# Patient Record
Sex: Female | Born: 1940 | Race: Black or African American | Hispanic: No | Marital: Married
Health system: Southern US, Community
[De-identification: ages and names within clinical notes are randomized; demographics above are authoritative.]

---

## 1997-08-03 ENCOUNTER — Ambulatory Visit (HOSPITAL_COMMUNITY): Admission: RE | Admit: 1997-08-03 | Discharge: 1997-08-03 | Payer: Self-pay | Admitting: Obstetrics

## 1997-09-06 ENCOUNTER — Other Ambulatory Visit: Admission: RE | Admit: 1997-09-06 | Discharge: 1997-09-06 | Payer: Self-pay | Admitting: Obstetrics

## 1998-09-13 ENCOUNTER — Ambulatory Visit (HOSPITAL_COMMUNITY): Admission: RE | Admit: 1998-09-13 | Discharge: 1998-09-13 | Payer: Self-pay | Admitting: Obstetrics

## 1998-09-18 ENCOUNTER — Other Ambulatory Visit: Admission: RE | Admit: 1998-09-18 | Discharge: 1998-09-18 | Payer: Self-pay | Admitting: Obstetrics

## 1998-09-21 ENCOUNTER — Ambulatory Visit (HOSPITAL_COMMUNITY): Admission: RE | Admit: 1998-09-21 | Discharge: 1998-09-21 | Payer: Self-pay | Admitting: Obstetrics

## 1998-09-21 ENCOUNTER — Encounter: Payer: Self-pay | Admitting: Obstetrics

## 1999-03-25 ENCOUNTER — Encounter: Payer: Self-pay | Admitting: Obstetrics

## 1999-03-25 ENCOUNTER — Ambulatory Visit (HOSPITAL_COMMUNITY): Admission: RE | Admit: 1999-03-25 | Discharge: 1999-03-25 | Payer: Self-pay | Admitting: Obstetrics

## 1999-04-02 ENCOUNTER — Ambulatory Visit (HOSPITAL_COMMUNITY): Admission: RE | Admit: 1999-04-02 | Discharge: 1999-04-02 | Payer: Self-pay | Admitting: Gastroenterology

## 1999-11-05 ENCOUNTER — Encounter: Payer: Self-pay | Admitting: Obstetrics

## 1999-11-05 ENCOUNTER — Encounter: Admission: RE | Admit: 1999-11-05 | Discharge: 1999-11-05 | Payer: Self-pay | Admitting: Obstetrics

## 1999-12-23 ENCOUNTER — Other Ambulatory Visit: Admission: RE | Admit: 1999-12-23 | Discharge: 1999-12-23 | Payer: Self-pay | Admitting: Obstetrics

## 2000-11-05 ENCOUNTER — Encounter: Payer: Self-pay | Admitting: Obstetrics

## 2000-11-05 ENCOUNTER — Ambulatory Visit (HOSPITAL_COMMUNITY): Admission: RE | Admit: 2000-11-05 | Discharge: 2000-11-05 | Payer: Self-pay | Admitting: *Deleted

## 2001-11-24 ENCOUNTER — Encounter: Payer: Self-pay | Admitting: Obstetrics

## 2001-11-24 ENCOUNTER — Ambulatory Visit (HOSPITAL_COMMUNITY): Admission: RE | Admit: 2001-11-24 | Discharge: 2001-11-24 | Payer: Self-pay | Admitting: Obstetrics

## 2002-12-01 ENCOUNTER — Encounter: Payer: Self-pay | Admitting: Obstetrics

## 2002-12-01 ENCOUNTER — Ambulatory Visit (HOSPITAL_COMMUNITY): Admission: RE | Admit: 2002-12-01 | Discharge: 2002-12-01 | Payer: Self-pay | Admitting: Obstetrics

## 2004-01-01 ENCOUNTER — Ambulatory Visit (HOSPITAL_COMMUNITY): Admission: RE | Admit: 2004-01-01 | Discharge: 2004-01-01 | Payer: Self-pay | Admitting: Obstetrics

## 2004-01-04 ENCOUNTER — Encounter: Admission: RE | Admit: 2004-01-04 | Discharge: 2004-01-04 | Payer: Self-pay | Admitting: Obstetrics

## 2012-12-16 DIAGNOSIS — R609 Edema, unspecified: Secondary | ICD-10-CM

## 2015-12-27 ENCOUNTER — Inpatient Hospital Stay
Admission: AD | Admit: 2015-12-27 | Discharge: 2016-01-16 | Disposition: A | Discharge status: Skilled Nursing Facility | Payer: Self-pay | Source: Other Acute Inpatient Hospital | Attending: Internal Medicine | Admitting: Internal Medicine

## 2015-12-27 DIAGNOSIS — R0602 Shortness of breath: Secondary | ICD-10-CM

## 2015-12-27 DIAGNOSIS — I509 Heart failure, unspecified: Secondary | ICD-10-CM

## 2015-12-27 DIAGNOSIS — E8770 Fluid overload, unspecified: Secondary | ICD-10-CM

## 2015-12-27 DIAGNOSIS — R140 Abdominal distension (gaseous): Secondary | ICD-10-CM

## 2015-12-28 ENCOUNTER — Other Ambulatory Visit (HOSPITAL_COMMUNITY): Payer: Self-pay

## 2015-12-28 LAB — COMPREHENSIVE METABOLIC PANEL
ALT: 9 U/L — ABNORMAL LOW (ref 14–54)
AST: 37 U/L (ref 15–41)
Albumin: 2.4 g/dL — ABNORMAL LOW (ref 3.5–5.0)
Alkaline Phosphatase: 69 U/L (ref 38–126)
Anion gap: 11 (ref 5–15)
BILIRUBIN TOTAL: 0.8 mg/dL (ref 0.3–1.2)
BUN: 21 mg/dL — AB (ref 6–20)
CO2: 22 mmol/L (ref 22–32)
CREATININE: 2.53 mg/dL — AB (ref 0.44–1.00)
Calcium: 8.5 mg/dL — ABNORMAL LOW (ref 8.9–10.3)
Chloride: 100 mmol/L — ABNORMAL LOW (ref 101–111)
GFR, EST AFRICAN AMERICAN: 20 mL/min — AB (ref >60)
GFR, EST NON AFRICAN AMERICAN: 17 mL/min — AB (ref >60)
Glucose, Bld: 141 mg/dL — ABNORMAL HIGH (ref 65–99)
POTASSIUM: 3.6 mmol/L (ref 3.5–5.1)
Sodium: 133 mmol/L — ABNORMAL LOW (ref 135–145)
TOTAL PROTEIN: 5.6 g/dL — AB (ref 6.5–8.1)

## 2015-12-28 LAB — CBC WITH DIFFERENTIAL/PLATELET
BASOS PCT: 1 %
Basophils Absolute: 0 10*3/uL (ref 0.0–0.1)
EOS ABS: 0.5 10*3/uL (ref 0.0–0.7)
EOS PCT: 8 %
HCT: 30.4 % — ABNORMAL LOW (ref 36.0–46.0)
HEMOGLOBIN: 9.8 g/dL — AB (ref 12.0–15.0)
LYMPHS ABS: 1.2 10*3/uL (ref 0.7–4.0)
Lymphocytes Relative: 21 %
MCH: 30.8 pg (ref 26.0–34.0)
MCHC: 32.2 g/dL (ref 30.0–36.0)
MCV: 95.6 fL (ref 78.0–100.0)
MONO ABS: 0.7 10*3/uL (ref 0.1–1.0)
MONOS PCT: 12 %
Neutro Abs: 3.5 10*3/uL (ref 1.7–7.7)
Neutrophils Relative %: 59 %
PLATELETS: 67 10*3/uL — AB (ref 150–400)
RBC: 3.18 MIL/uL — ABNORMAL LOW (ref 3.87–5.11)
RDW: 18.5 % — AB (ref 11.5–15.5)
WBC: 6 10*3/uL (ref 4.0–10.5)

## 2015-12-28 LAB — TSH: TSH: 6.989 u[IU]/mL — ABNORMAL HIGH (ref 0.350–4.500)

## 2015-12-28 LAB — PROTIME-INR
INR: 1.9 — ABNORMAL HIGH (ref 0.00–1.49)
PROTHROMBIN TIME: 21.7 s — AB (ref 11.6–15.2)

## 2015-12-28 LAB — BRAIN NATRIURETIC PEPTIDE: B NATRIURETIC PEPTIDE 5: 146.3 pg/mL — AB (ref 0.0–100.0)

## 2015-12-28 LAB — MAGNESIUM: MAGNESIUM: 1.7 mg/dL (ref 1.7–2.4)

## 2015-12-28 LAB — PHOSPHORUS: PHOSPHORUS: 2.6 mg/dL (ref 2.5–4.6)

## 2015-12-28 NOTE — Consult Note (Signed)
Date: 12/28/2015                  Patient Name:  Christina Duke  MRN: [id number]  DOB: 1940-07-13  Age / Sex: 75 y.o., female         PCP: No primary care provider on file.                 Service Requesting Consult: Internal medicine                 Reason for Consult: Acute renal failure, dialysis            History of Present Illness: Patient is a 75 y.o. female with medical problems of chronic kidney disease stage IV, diabetic nephropathy, plus history of DVT, gout, chronic thrombocytopenia, liver cirrhosis, deconditioning, morbid obesity, who was admitted on 12/27/2015   Patient was hospitalized initially for generalized swelling, anasarca. Hospital course complicated by GI bleed and DVT. She was also treated for UTI with broad-spectrum antibiotics. Patient underwent ultrasound-guided paracentesis on July 11.  8700 cc of clear fluid was removed.  According to transfer notes, dialysis catheter was placed on July 5  And patient was initiated on hemodialysis. Per patient, she has had 5 treatments until now.  Medications: Outpatient medications: No prescriptions prior to admission    Current medications: No current facility-administered medications for this encounter.   famotidine Metoprolol 12.5 mg twice a day Prostat 30 mL twice a day Heparin 5000 units subcutaneous every 8 hours   Allergies: Allergies not on file  Cefazolin, doxycycline  Past Medical History: No past medical history on file. As noted above  Past Surgical History: No past surgical history on file.   Family History: No family history on file.   Social History: Social History   Social History  . Marital Status: Married    Spouse Name: N/A  . Number of Children: N/A  . Years of Education: N/A   Occupational History  . Not on file.   Social History Main Topics  . Smoking status: Not on file  . Smokeless tobacco: Not on file  . Alcohol Use: Not on file  . Drug Use: Not on file  .  Sexual Activity: Not on file   Other Topics Concern  . Not on file   Social History Narrative  . No narrative on file     Review of Systems: Gen: no fevers, chills HEENT: no blurry vision, no hearing issues CV: no chest pain or shortness of breath Resp: no cough, sputum XB:JYNWGNFA is okay, no blood in stool GU : decreased urine output MS: difficulty walking Derm:  no acute complaints Psych:no complaints Heme: no complaints Neuro: weakness in legs Endocrine. Diabetic  Vital Signs: There were no vitals taken for this visit.  No intake or output data in the 24 hours ending 12/28/15 1541  Weight trends: There were no vitals filed for this visit.  Physical Exam: General:  elderly woman, laying in the bed  HEENT  anicteric, moist mucous membranes  Neck:  supple  Lungs: Normal breathing effort, clear to auscultation  Heart::  regular rhythm, no rub or gallop  Abdomen: Soft, distended, nontender  Extremities: left leg edema  Neurologic: Alert, oriented  Skin: No acute rashes  Access: Right IJ PermCath          Lab results: Basic Metabolic Panel:  Recent Labs Lab 12/28/15 0534  NA 133*  K 3.6  CL 100*  CO2 22  GLUCOSE 141*  BUN 21*  CREATININE 2.53*  CALCIUM 8.5*  MG 1.7  PHOS 2.6    Liver Function Tests:  Recent Labs Lab 12/28/15 0534  AST 37  ALT 9*  ALKPHOS 69  BILITOT 0.8  PROT 5.6*  ALBUMIN 2.4*   No results for input(s): LIPASE, AMYLASE in the last 168 hours. No results for input(s): AMMONIA in the last 168 hours.  CBC:  Recent Labs Lab 12/28/15 0534  WBC 6.0  NEUTROABS 3.5  HGB 9.8*  HCT 30.4*  MCV 95.6  PLT 67*    Cardiac Enzymes: No results for input(s): CKTOTAL, TROPONINI in the last 168 hours.  BNP: Invalid input(s): POCBNP  CBG: No results for input(s): GLUCAP in the last 168 hours.  Microbiology: No results found for this or any previous visit (from the past 720 hour(s)).   Coagulation Studies:  Recent  Labs  12/28/15 0534  LABPROT 21.7*  INR 1.90*    Urinalysis: No results for input(s): COLORURINE, LABSPEC, PHURINE, GLUCOSEU, HGBUR, BILIRUBINUR, KETONESUR, PROTEINUR, UROBILINOGEN, NITRITE, LEUKOCYTESUR in the last 72 hours.  Invalid input(s): APPERANCEUR      Imaging: Dg Chest Port 1 View  12/28/2015  CLINICAL DATA:  Congestive heart failure EXAM: PORTABLE CHEST 1 VIEW COMPARISON:  08/13/2015; chest CT - 08/13/2015 FINDINGS: Grossly unchanged enlarged cardiac silhouette and mediastinal contours with atherosclerotic plaque within the thoracic aorta. Interval placement of right internal jugular approach dialysis catheter with tips terminating over the superior aspect of the right atrium. Decreased lung volumes with minimal bibasilar opacities, likely atelectasis. Minimal perihilar opacities also favored to represent atelectasis. No discrete focal airspace opacities. No pleural effusion or pneumothorax. No evidence of edema. Unchanged bones including post left-sided rotator cuff repair advanced degenerative change of the left glenohumeral joint, incompletely evaluated. IMPRESSION: 1. Cardiomegaly, hypoventilation bibasilar atelectasis without definitive superimposed acute cardiopulmonary disease. Specifically, no evidence of edema. 2.  Aortic Atherosclerosis (ICD10-170.0) Electronically Signed   By: Simonne ComeJohn  Watts M.D.   On: 12/28/2015 07:42      Assessment & Plan: Pt is a 75 y.o. yo female with  medical problems of chronic kidney disease stage IV, diabetic nephropathy, plus history of DVT, gout, chronic thrombocytopenia, liver cirrhosis, deconditioning, morbid obesity, who was admitted on 12/27/2015   1. Acute on chronic kidney disease stage IV - Failed Lasix drip at outside hospital and initiated on hemodialysis - We will continue hemodialysis on a regular schedule  2. Cirrhosis with sequlae - thrombocytopenia  3. Anemia - check iron studies

## 2015-12-29 LAB — CBC WITH DIFFERENTIAL/PLATELET
BASOS ABS: 0 10*3/uL (ref 0.0–0.1)
BASOS PCT: 1 %
Eosinophils Absolute: 0.5 10*3/uL (ref 0.0–0.7)
Eosinophils Relative: 8 %
HEMATOCRIT: 30.6 % — AB (ref 36.0–46.0)
HEMOGLOBIN: 9.9 g/dL — AB (ref 12.0–15.0)
Lymphocytes Relative: 20 %
Lymphs Abs: 1.1 10*3/uL (ref 0.7–4.0)
MCH: 30.5 pg (ref 26.0–34.0)
MCHC: 32.4 g/dL (ref 30.0–36.0)
MCV: 94.2 fL (ref 78.0–100.0)
Monocytes Absolute: 0.6 10*3/uL (ref 0.1–1.0)
Monocytes Relative: 11 %
NEUTROS ABS: 3.4 10*3/uL (ref 1.7–7.7)
NEUTROS PCT: 60 %
Platelets: 70 10*3/uL — ABNORMAL LOW (ref 150–400)
RBC: 3.25 MIL/uL — AB (ref 3.87–5.11)
RDW: 18.4 % — ABNORMAL HIGH (ref 11.5–15.5)
WBC: 5.6 10*3/uL (ref 4.0–10.5)

## 2015-12-29 LAB — MAGNESIUM: MAGNESIUM: 1.9 mg/dL (ref 1.7–2.4)

## 2015-12-29 LAB — FERRITIN: FERRITIN: 124 ng/mL (ref 11–307)

## 2015-12-29 LAB — RENAL FUNCTION PANEL
ALBUMIN: 2.6 g/dL — AB (ref 3.5–5.0)
ANION GAP: 7 (ref 5–15)
BUN: 28 mg/dL — AB (ref 6–20)
CO2: 23 mmol/L (ref 22–32)
Calcium: 8.5 mg/dL — ABNORMAL LOW (ref 8.9–10.3)
Chloride: 100 mmol/L — ABNORMAL LOW (ref 101–111)
Creatinine, Ser: 2.71 mg/dL — ABNORMAL HIGH (ref 0.44–1.00)
GFR, EST AFRICAN AMERICAN: 19 mL/min — AB (ref >60)
GFR, EST NON AFRICAN AMERICAN: 16 mL/min — AB (ref >60)
Glucose, Bld: 126 mg/dL — ABNORMAL HIGH (ref 65–99)
PHOSPHORUS: 3.2 mg/dL (ref 2.5–4.6)
POTASSIUM: 3.6 mmol/L (ref 3.5–5.1)
Sodium: 130 mmol/L — ABNORMAL LOW (ref 135–145)

## 2015-12-29 LAB — IRON AND TIBC: Iron: 31 ug/dL (ref 28–170)

## 2015-12-29 LAB — TRANSFERRIN

## 2015-12-29 LAB — HEMOGLOBIN A1C
HEMOGLOBIN A1C: 5.7 % — AB (ref 4.8–5.6)
Mean Plasma Glucose: 117 mg/dL

## 2015-12-29 LAB — T4, FREE: Free T4: 1.34 ng/dL — ABNORMAL HIGH (ref 0.61–1.12)

## 2015-12-30 LAB — HEPATITIS B SURFACE ANTIBODY,QUALITATIVE: Hep B S Ab: NONREACTIVE

## 2015-12-30 LAB — HEPATITIS B SURFACE ANTIGEN: Hepatitis B Surface Ag: NEGATIVE

## 2015-12-30 LAB — HEPATITIS B CORE ANTIBODY, TOTAL: HEP B C TOTAL AB: NEGATIVE

## 2015-12-31 LAB — RENAL FUNCTION PANEL
Albumin: 2.5 g/dL — ABNORMAL LOW (ref 3.5–5.0)
Anion gap: 8 (ref 5–15)
BUN: 30 mg/dL — AB (ref 6–20)
CHLORIDE: 103 mmol/L (ref 101–111)
CO2: 21 mmol/L — ABNORMAL LOW (ref 22–32)
Calcium: 8.2 mg/dL — ABNORMAL LOW (ref 8.9–10.3)
Creatinine, Ser: 2.93 mg/dL — ABNORMAL HIGH (ref 0.44–1.00)
GFR calc Af Amer: 17 mL/min — ABNORMAL LOW (ref >60)
GFR, EST NON AFRICAN AMERICAN: 15 mL/min — AB (ref >60)
Glucose, Bld: 100 mg/dL — ABNORMAL HIGH (ref 65–99)
POTASSIUM: 4.2 mmol/L (ref 3.5–5.1)
Phosphorus: 3.8 mg/dL (ref 2.5–4.6)
Sodium: 132 mmol/L — ABNORMAL LOW (ref 135–145)

## 2015-12-31 LAB — CBC
HEMATOCRIT: 30.2 % — AB (ref 36.0–46.0)
Hemoglobin: 10 g/dL — ABNORMAL LOW (ref 12.0–15.0)
MCH: 31.3 pg (ref 26.0–34.0)
MCHC: 33.1 g/dL (ref 30.0–36.0)
MCV: 94.4 fL (ref 78.0–100.0)
Platelets: 60 10*3/uL — ABNORMAL LOW (ref 150–400)
RBC: 3.2 MIL/uL — ABNORMAL LOW (ref 3.87–5.11)
RDW: 18.7 % — AB (ref 11.5–15.5)
WBC: 6.5 10*3/uL (ref 4.0–10.5)

## 2015-12-31 NOTE — Progress Notes (Signed)
  [facility]  ROUNDING NOTE   Subjective:  Patient completed dialysis today.  Net fluid removal was 200cc. Sitting up in chair at the moment.    Objective:  Vital signs in last 24 hours:   T 97.3 P: 87 R: 18 BP: 113/59     Physical Exam: General: NAD, sitting up  Head: Normocephalic, atraumatic. Moist oral mucosal membranes  Eyes: Anicteric  Neck: Supple, trachea midline  Lungs:  Clear to auscultation normal effort  Heart: S1S2 no rubs  Abdomen:  Soft, nontender, distended, BS present   Extremities: trace peripheral edema.  Neurologic: Nonfocal, moving all four extremities  Skin: No lesions  Access: R IJ PC    Basic Metabolic Panel:  Recent Labs Lab 12/28/15 0534 12/29/15 0755 12/29/15 0757 12/31/15 0520  NA 133*  --  130* 132*  K 3.6  --  3.6 4.2  CL 100*  --  100* 103  CO2 22  --  23 21*  GLUCOSE 141*  --  126* 100*  BUN 21*  --  28* 30*  CREATININE 2.53*  --  2.71* 2.93*  CALCIUM 8.5*  --  8.5* 8.2*  MG 1.7 1.9  --   --   PHOS 2.6  --  3.2 3.8    Liver Function Tests:  Recent Labs Lab 12/28/15 0534 12/29/15 0757 12/31/15 0520  AST 37  --   --   ALT 9*  --   --   ALKPHOS 69  --   --   BILITOT 0.8  --   --   PROT 5.6*  --   --   ALBUMIN 2.4* 2.6* 2.5*   No results for input(s): LIPASE, AMYLASE in the last 168 hours. No results for input(s): AMMONIA in the last 168 hours.  CBC:  Recent Labs Lab 12/28/15 0534 12/29/15 0755  WBC 6.0 5.6  NEUTROABS 3.5 3.4  HGB 9.8* 9.9*  HCT 30.4* 30.6*  MCV 95.6 94.2  PLT 67* 70*    Cardiac Enzymes: No results for input(s): CKTOTAL, CKMB, CKMBINDEX, TROPONINI in the last 168 hours.  BNP: Invalid input(s): POCBNP  CBG: No results for input(s): GLUCAP in the last 168 hours.  Microbiology: No results found for this or any previous visit.  Coagulation Studies: No results for input(s): LABPROT, INR in the last 72 hours.  Urinalysis: No results for input(s): COLORURINE, LABSPEC,  PHURINE, GLUCOSEU, HGBUR, BILIRUBINUR, KETONESUR, PROTEINUR, UROBILINOGEN, NITRITE, LEUKOCYTESUR in the last 72 hours.  Invalid input(s): APPERANCEUR    Imaging: No results found.   Medications:       Assessment/ Plan:  75 y.o. female with medical problems of chronic kidney disease stage IV, diabetic nephropathy, plus history of DVT, gout, chronic thrombocytopenia, liver cirrhosis, deconditioning, morbid obesity, who was admitted on 12/27/2015   1.  Acute renal failure/CKD stage IV:  Pt completed HD today, UF achieved was 200cc, we will plan for HD again on Wednesday for now, unclear if she'll regain enough renal function to come off of dialysis.   2.  Anemia of CKD:  Hgb currently 9.9, consider adding aranesp to her medication regimen if hgb drops further.   3.  Hyponatremia: likely due to liver disease, continue to monitor serum Na.    LOS:  Christina Duke 7/17/20173:09 PM

## 2016-01-01 ENCOUNTER — Other Ambulatory Visit (HOSPITAL_COMMUNITY): Payer: Self-pay

## 2016-01-01 LAB — COMPREHENSIVE METABOLIC PANEL
ALT: 11 U/L — ABNORMAL LOW (ref 14–54)
AST: 45 U/L — AB (ref 15–41)
Albumin: 2.5 g/dL — ABNORMAL LOW (ref 3.5–5.0)
Alkaline Phosphatase: 87 U/L (ref 38–126)
Anion gap: 9 (ref 5–15)
BILIRUBIN TOTAL: 1.7 mg/dL — AB (ref 0.3–1.2)
BUN: 18 mg/dL (ref 6–20)
CALCIUM: 8 mg/dL — AB (ref 8.9–10.3)
CO2: 24 mmol/L (ref 22–32)
Chloride: 100 mmol/L — ABNORMAL LOW (ref 101–111)
Creatinine, Ser: 2.49 mg/dL — ABNORMAL HIGH (ref 0.44–1.00)
GFR, EST AFRICAN AMERICAN: 21 mL/min — AB (ref >60)
GFR, EST NON AFRICAN AMERICAN: 18 mL/min — AB (ref >60)
GLUCOSE: 90 mg/dL (ref 65–99)
Potassium: 3.6 mmol/L (ref 3.5–5.1)
Sodium: 133 mmol/L — ABNORMAL LOW (ref 135–145)
Total Protein: 5.9 g/dL — ABNORMAL LOW (ref 6.5–8.1)

## 2016-01-01 LAB — CBC WITH DIFFERENTIAL/PLATELET
BASOS PCT: 0 %
Basophils Absolute: 0 10*3/uL (ref 0.0–0.1)
EOS ABS: 0.5 10*3/uL (ref 0.0–0.7)
EOS PCT: 10 %
HEMATOCRIT: 29.4 % — AB (ref 36.0–46.0)
Hemoglobin: 9.5 g/dL — ABNORMAL LOW (ref 12.0–15.0)
Lymphocytes Relative: 24 %
Lymphs Abs: 1.2 10*3/uL (ref 0.7–4.0)
MCH: 30.7 pg (ref 26.0–34.0)
MCHC: 32.3 g/dL (ref 30.0–36.0)
MCV: 95.1 fL (ref 78.0–100.0)
MONO ABS: 0.7 10*3/uL (ref 0.1–1.0)
MONOS PCT: 14 %
NEUTROS ABS: 2.6 10*3/uL (ref 1.7–7.7)
Neutrophils Relative %: 52 %
PLATELETS: 66 10*3/uL — AB (ref 150–400)
RBC: 3.09 MIL/uL — ABNORMAL LOW (ref 3.87–5.11)
RDW: 18.9 % — AB (ref 11.5–15.5)
WBC: 5 10*3/uL (ref 4.0–10.5)

## 2016-01-02 ENCOUNTER — Other Ambulatory Visit (HOSPITAL_COMMUNITY): Payer: Self-pay

## 2016-01-02 LAB — RENAL FUNCTION PANEL
ANION GAP: 11 (ref 5–15)
Albumin: 2.4 g/dL — ABNORMAL LOW (ref 3.5–5.0)
BUN: 21 mg/dL — ABNORMAL HIGH (ref 6–20)
CHLORIDE: 99 mmol/L — AB (ref 101–111)
CO2: 22 mmol/L (ref 22–32)
Calcium: 8.3 mg/dL — ABNORMAL LOW (ref 8.9–10.3)
Creatinine, Ser: 2.62 mg/dL — ABNORMAL HIGH (ref 0.44–1.00)
GFR calc non Af Amer: 17 mL/min — ABNORMAL LOW (ref >60)
GFR, EST AFRICAN AMERICAN: 19 mL/min — AB (ref >60)
GLUCOSE: 132 mg/dL — AB (ref 65–99)
Phosphorus: 4 mg/dL (ref 2.5–4.6)
Potassium: 3.9 mmol/L (ref 3.5–5.1)
Sodium: 132 mmol/L — ABNORMAL LOW (ref 135–145)

## 2016-01-02 LAB — CBC
HCT: 28.9 % — ABNORMAL LOW (ref 36.0–46.0)
HEMOGLOBIN: 9.5 g/dL — AB (ref 12.0–15.0)
MCH: 31.1 pg (ref 26.0–34.0)
MCHC: 32.9 g/dL (ref 30.0–36.0)
MCV: 94.8 fL (ref 78.0–100.0)
PLATELETS: 68 10*3/uL — AB (ref 150–400)
RBC: 3.05 MIL/uL — AB (ref 3.87–5.11)
RDW: 18.6 % — ABNORMAL HIGH (ref 11.5–15.5)
WBC: 6.2 10*3/uL (ref 4.0–10.5)

## 2016-01-02 MED ORDER — LIDOCAINE HCL (PF) 1 % IJ SOLN
INTRAMUSCULAR | Status: AC
  Filled 2016-01-02: qty 10

## 2016-01-02 NOTE — Progress Notes (Signed)
[facility]  ROUNDING NOTE   Subjective:  Patient completed hemodialysis today. Ultrafiltration achieved was 1 kg. Resting comfortably in bed at the moment.   Objective:  Vital signs in last 24 hours:  Temperature 97.7 pulse 87 respirations 22 blood pressure 113/59     Physical Exam: General: NAD, sitting up  Head: Normocephalic, atraumatic. Moist oral mucosal membranes  Eyes: Anicteric  Neck: Supple, trachea midline  Lungs:  Clear to auscultation normal effort  Heart: S1S2 no rubs  Abdomen:  Soft, nontender, distended, BS present   Extremities: trace peripheral edema.  Neurologic: Nonfocal, moving all four extremities  Skin: No lesions  Access: R IJ PC    Basic Metabolic Panel:  Recent Labs Lab 12/28/15 0534 12/29/15 0755 12/29/15 0757 12/31/15 0520 01/01/16 0644 01/02/16 0520  NA 133*  --  130* 132* 133* 132*  K 3.6  --  3.6 4.2 3.6 3.9  CL 100*  --  100* 103 100* 99*  CO2 22  --  23 21* 24 22  GLUCOSE 141*  --  126* 100* 90 132*  BUN 21*  --  28* 30* 18 21*  CREATININE 2.53*  --  2.71* 2.93* 2.49* 2.62*  CALCIUM 8.5*  --  8.5* 8.2* 8.0* 8.3*  MG 1.7 1.9  --   --   --   --   PHOS 2.6  --  3.2 3.8  --  4.0    Liver Function Tests:  Recent Labs Lab 12/28/15 0534 12/29/15 0757 12/31/15 0520 01/01/16 0644 01/02/16 0520  AST 37  --   --  45*  --   ALT 9*  --   --  11*  --   ALKPHOS 69  --   --  87  --   BILITOT 0.8  --   --  1.7*  --   PROT 5.6*  --   --  5.9*  --   ALBUMIN 2.4* 2.6* 2.5* 2.5* 2.4*   No results for input(s): LIPASE, AMYLASE in the last 168 hours. No results for input(s): AMMONIA in the last 168 hours.  CBC:  Recent Labs Lab 12/28/15 0534 12/29/15 0755 12/31/15 1652 01/01/16 0644 01/02/16 0520  WBC 6.0 5.6 6.5 5.0 6.2  NEUTROABS 3.5 3.4  --  2.6  --   HGB 9.8* 9.9* 10.0* 9.5* 9.5*  HCT 30.4* 30.6* 30.2* 29.4* 28.9*  MCV 95.6 94.2 94.4 95.1 94.8  PLT 67* 70* 60* 66* 68*    Cardiac Enzymes: No results for  input(s): CKTOTAL, CKMB, CKMBINDEX, TROPONINI in the last 168 hours.  BNP: Invalid input(s): POCBNP  CBG: No results for input(s): GLUCAP in the last 168 hours.  Microbiology: No results found for this or any previous visit.  Coagulation Studies: No results for input(s): LABPROT, INR in the last 72 hours.  Urinalysis: No results for input(s): COLORURINE, LABSPEC, PHURINE, GLUCOSEU, HGBUR, BILIRUBINUR, KETONESUR, PROTEINUR, UROBILINOGEN, NITRITE, LEUKOCYTESUR in the last 72 hours.  Invalid input(s): APPERANCEUR    Imaging: Dg Chest Port 1 View  01/01/2016  CLINICAL DATA:  Shortness of breath. EXAM: PORTABLE CHEST 1 VIEW COMPARISON:  12/28/2015. FINDINGS: Right IJ dual-lumen catheter noted in stable position with tip in the right atrium. Cardiomegaly with normal pulmonary vascularity. Low lung volumes with mild bibasilar atelectasis. No pleural effusion or pneumothorax. Severe degenerative changes left shoulder. IMPRESSION: 1. Right IJ dual-lumen catheter noted with tip in the right atrium in unchanged position. 2. Cardiomegaly. No evidence of overt congestive heart failure. Low lung volumes  with bibasilar atelectasis. Electronically Signed   By: Maisie Fushomas  Register   On: 01/01/2016 07:21     Medications:       Assessment/ Plan:  75 y.o. female with medical problems of chronic kidney disease stage IV, diabetic nephropathy, plus history of DVT, gout, chronic thrombocytopenia, liver cirrhosis, deconditioning, morbid obesity, who was admitted on 12/27/2015   1.  Acute renal failure/CKD stage IV:  Patient completed hemodialysis today. Unclear as to whether she'll regain enough kidney function to come off of dialysis.we will plan for dialysis again on Friday. Ultrafiltration target 1 kg.  2.  Anemia of CKD:  Hemoglobin currently 9.5. We will start the patient on Aranesp per protocol.  3.  Hyponatremia: likely due to liver disease, Sodium currently 132. Continue to monitor.    LOS:   Wallice Granville 7/19/20173:42 PM

## 2016-01-02 NOTE — Procedures (Signed)
   US guided RLQ paracentesis  2 liter removed Maximum per MD Yellow fluid  Tolerated well

## 2016-01-03 LAB — BASIC METABOLIC PANEL
Anion gap: 5 (ref 5–15)
BUN: 16 mg/dL (ref 6–20)
CALCIUM: 8.1 mg/dL — AB (ref 8.9–10.3)
CO2: 26 mmol/L (ref 22–32)
CREATININE: 2.32 mg/dL — AB (ref 0.44–1.00)
Chloride: 101 mmol/L (ref 101–111)
GFR, EST AFRICAN AMERICAN: 23 mL/min — AB (ref >60)
GFR, EST NON AFRICAN AMERICAN: 19 mL/min — AB (ref >60)
GLUCOSE: 124 mg/dL — AB (ref 65–99)
Potassium: 3.7 mmol/L (ref 3.5–5.1)
Sodium: 132 mmol/L — ABNORMAL LOW (ref 135–145)

## 2016-01-03 LAB — CBC
HCT: 32.7 % — ABNORMAL LOW (ref 36.0–46.0)
Hemoglobin: 10.5 g/dL — ABNORMAL LOW (ref 12.0–15.0)
MCH: 30.6 pg (ref 26.0–34.0)
MCHC: 32.1 g/dL (ref 30.0–36.0)
MCV: 95.3 fL (ref 78.0–100.0)
PLATELETS: 59 10*3/uL — AB (ref 150–400)
RBC: 3.43 MIL/uL — ABNORMAL LOW (ref 3.87–5.11)
RDW: 18.5 % — ABNORMAL HIGH (ref 11.5–15.5)
WBC: 5.2 10*3/uL (ref 4.0–10.5)

## 2016-01-03 LAB — PTH, INTACT AND CALCIUM
CALCIUM TOTAL (PTH): 7.6 mg/dL — AB (ref 8.7–10.3)
PTH: 57 pg/mL (ref 15–65)

## 2016-01-04 LAB — RENAL FUNCTION PANEL
ALBUMIN: 2.4 g/dL — AB (ref 3.5–5.0)
ANION GAP: 7 (ref 5–15)
BUN: 21 mg/dL — ABNORMAL HIGH (ref 6–20)
CALCIUM: 8.3 mg/dL — AB (ref 8.9–10.3)
CO2: 25 mmol/L (ref 22–32)
Chloride: 100 mmol/L — ABNORMAL LOW (ref 101–111)
Creatinine, Ser: 2.68 mg/dL — ABNORMAL HIGH (ref 0.44–1.00)
GFR calc non Af Amer: 16 mL/min — ABNORMAL LOW (ref >60)
GFR, EST AFRICAN AMERICAN: 19 mL/min — AB (ref >60)
Glucose, Bld: 98 mg/dL (ref 65–99)
PHOSPHORUS: 3.3 mg/dL (ref 2.5–4.6)
Potassium: 4 mmol/L (ref 3.5–5.1)
SODIUM: 132 mmol/L — AB (ref 135–145)

## 2016-01-04 LAB — CBC
HCT: 31.5 % — ABNORMAL LOW (ref 36.0–46.0)
HEMOGLOBIN: 10.2 g/dL — AB (ref 12.0–15.0)
MCH: 30.6 pg (ref 26.0–34.0)
MCHC: 32.4 g/dL (ref 30.0–36.0)
MCV: 94.6 fL (ref 78.0–100.0)
Platelets: 72 10*3/uL — ABNORMAL LOW (ref 150–400)
RBC: 3.33 MIL/uL — AB (ref 3.87–5.11)
RDW: 18.3 % — ABNORMAL HIGH (ref 11.5–15.5)
WBC: 5.9 10*3/uL (ref 4.0–10.5)

## 2016-01-04 NOTE — Progress Notes (Signed)
[facility]  ROUNDING NOTE   Subjective:  Patient seen and evaluated during hemodialysis. Tolerating well. Ultrafiltration target cut down to 0.5 kg.   Objective:  Vital signs in last 24 hours:  Temperature 97.7 pulse 87 respirations 22 blood pressure 112/61     Physical Exam: General: NAD, sitting up  Head: Normocephalic, atraumatic. Moist oral mucosal membranes  Eyes: Anicteric  Neck: Supple, trachea midline  Lungs:  Clear to auscultation normal effort  Heart: S1S2 no rubs  Abdomen:  Soft, nontender, distended, BS present   Extremities: trace peripheral edema.  Neurologic: Nonfocal, moving all four extremities  Skin: No lesions  Access: R IJ PC    Basic Metabolic Panel:  Recent Labs Lab 12/29/15 0755  12/29/15 0757 12/31/15 0520 01/01/16 0644 01/02/16 0520 01/03/16 0611 01/04/16 0630  NA  --   < > 130* 132* 133* 132* 132* 132*  K  --   < > 3.6 4.2 3.6 3.9 3.7 4.0  CL  --   < > 100* 103 100* 99* 101 100*  CO2  --   < > 23 21* GLUCOSE  --   < > 126* 100* 90 132* 124* 98  BUN  --   < > 28* 30* 18 21* 16 21*  CREATININE  --   < > 2.71* 2.93* 2.49* 2.62* 2.32* 2.68*  CALCIUM  --   < > 8.5* 8.2* 8.0* 8.3*  7.6* 8.1* 8.3*  MG 1.9  --   --   --   --   --   --   --   PHOS  --   --  3.2 3.8  --  4.0  --  3.3  < > = values in this interval not displayed.  Liver Function Tests:  Recent Labs Lab 12/29/15 0757 12/31/15 0520 01/01/16 0644 01/02/16 0520 01/04/16 0630  AST  --   --  45*  --   --   ALT  --   --  11*  --   --   ALKPHOS  --   --  87  --   --   BILITOT  --   --  1.7*  --   --   PROT  --   --  5.9*  --   --   ALBUMIN 2.6* 2.5* 2.5* 2.4* 2.4*   No results for input(s): LIPASE, AMYLASE in the last 168 hours. No results for input(s): AMMONIA in the last 168 hours.  CBC:  Recent Labs Lab 12/29/15 0755 12/31/15 1652 01/01/16 0644 01/02/16 0520 01/03/16 0611 01/04/16 0625  WBC 5.6 6.5 5.0 6.2 5.2 5.9  NEUTROABS 3.4   --  2.6  --   --   --   HGB 9.9* 10.0* 9.5* 9.5* 10.5* 10.2*  HCT 30.6* 30.2* 29.4* 28.9* 32.7* 31.5*  MCV 94.2 94.4 95.1 94.8 95.3 94.6  PLT 70* 60* 66* 68* 59* 72*    Cardiac Enzymes: No results for input(s): CKTOTAL, CKMB, CKMBINDEX, TROPONINI in the last 168 hours.  BNP: Invalid input(s): POCBNP  CBG: No results for input(s): GLUCAP in the last 168 hours.  Microbiology: No results found for this or any previous visit.  Coagulation Studies: No results for input(s): LABPROT, INR in the last 72 hours.  Urinalysis: No results for input(s): COLORURINE, LABSPEC, PHURINE, GLUCOSEU, HGBUR, BILIRUBINUR, KETONESUR, PROTEINUR, UROBILINOGEN, NITRITE, LEUKOCYTESUR in the last 72 hours.  Invalid input(s): APPERANCEUR    Imaging: US Paracentesis  01/02/2016  INDICATION: ascites  EXAM: ULTRASOUND-GUIDED PARACENTESIS COMPARISON:  None. MEDICATIONS: 10 cc 1% lidocaine COMPLICATIONS: None immediate. TECHNIQUE: Informed written consent was obtained from the patient after a discussion of the risks, benefits and alternatives to treatment. A timeout was performed prior to the initiation of the procedure. Initial ultrasound scanning demonstrates a large amount of ascites within the right lower abdominal quadrant. The right lower abdomen was prepped and draped in the usual sterile fashion. 1% lidocaine with epinephrine was used for local anesthesia. Under direct ultrasound guidance, a 19 gauge, 7-cm, Yueh catheter was introduced. An ultrasound image was saved for documentation purposed. The paracentesis was performed. The catheter was removed and a dressing was applied. The patient tolerated the procedure well without immediate post procedural complication. FINDINGS: A total of approximately 2 liters of yellow fluid was removed. Maximum per MD IMPRESSION: Successful ultrasound-guided paracentesis yielding 2 liters of peritoneal fluid. Read by Robet LeuPamela A [facility] Electronically Signed   By: Simonne ComeJohn  Watts M.D.    On: 01/02/2016 16:18     Medications:       Assessment/ Plan:  75 y.o. female with medical problems of chronic kidney disease stage IV, diabetic nephropathy, plus history of DVT, gout, chronic thrombocytopenia, liver cirrhosis, deconditioning, morbid obesity, who was admitted on 12/27/2015   1.  Acute renal failure/CKD stage IV:  Patient seen and evaluated during hemodialysis. Tolerating well. Remains unclear as to whether she will be able to come off of dialysis. Discussed with nursing. Doesn't appear that she's having significant urine output.  2.  Anemia of CKD:  Patient started on Aranesp 60 mcg subcutaneous weekly.  3.  Hyponatremia: serum sodium currently 132 and stable. Continue to monitor.   LOS:  Christina Duke 7/21/20178:58 AM

## 2016-01-05 LAB — PTH, INTACT AND CALCIUM
Calcium, Total (PTH): 7.4 mg/dL — ABNORMAL LOW (ref 8.7–10.3)
PTH: 54 pg/mL (ref 15–65)

## 2016-01-07 LAB — CBC
HCT: 31.1 % — ABNORMAL LOW (ref 36.0–46.0)
Hemoglobin: 10 g/dL — ABNORMAL LOW (ref 12.0–15.0)
MCH: 30.6 pg (ref 26.0–34.0)
MCHC: 32.2 g/dL (ref 30.0–36.0)
MCV: 95.1 fL (ref 78.0–100.0)
PLATELETS: 78 10*3/uL — AB (ref 150–400)
RBC: 3.27 MIL/uL — AB (ref 3.87–5.11)
RDW: 18.4 % — AB (ref 11.5–15.5)
WBC: 5.8 10*3/uL (ref 4.0–10.5)

## 2016-01-07 LAB — RENAL FUNCTION PANEL
Albumin: 2.3 g/dL — ABNORMAL LOW (ref 3.5–5.0)
Anion gap: 9 (ref 5–15)
BUN: 28 mg/dL — AB (ref 6–20)
CALCIUM: 8.3 mg/dL — AB (ref 8.9–10.3)
CHLORIDE: 99 mmol/L — AB (ref 101–111)
CO2: 25 mmol/L (ref 22–32)
CREATININE: 2.77 mg/dL — AB (ref 0.44–1.00)
GFR, EST AFRICAN AMERICAN: 18 mL/min — AB (ref >60)
GFR, EST NON AFRICAN AMERICAN: 16 mL/min — AB (ref >60)
Glucose, Bld: 62 mg/dL — ABNORMAL LOW (ref 65–99)
Phosphorus: 3.8 mg/dL (ref 2.5–4.6)
Potassium: 4 mmol/L (ref 3.5–5.1)
SODIUM: 133 mmol/L — AB (ref 135–145)

## 2016-01-07 NOTE — Progress Notes (Signed)
[facility]  ROUNDING NOTE   Subjective:  Patient had HD earlier today No UF because of low BP Patient ended up 200 cc positive At present, sitting up eating lunch Family at bedside. Asking for meds to increase appetite. Patient states she worked with PT She had Paracentesis on 7/19. 2 L of ascitic fluid was removed   Objective:  Vital signs in last 24 hours:  Temperature 96.5 pulse 89 respirations 17 blood pressure 107/58  No intake/output data recorded.  Physical Exam: General: NAD, sitting up  Head: Normocephalic, atraumatic. Moist oral mucosal membranes  Eyes: Anicteric  Neck: Supple, trachea midline  Lungs:  Clear to auscultation normal effort  Heart: S1S2 no rubs  Abdomen:  Soft, nontender, distended, BS present   Extremities: trace peripheral edema b/l  Neurologic: Nonfocal, moving all four extremities  Skin: No lesions  Access: R IJ PC    Basic Metabolic Panel:  Recent Labs Lab 01/01/16 0644  01/02/16 0520 01/03/16 0611 01/04/16 0625 01/04/16 0630 01/07/16 0600  NA 133*  --  132* 132*  --  132* 133*  K 3.6  --  3.9 3.7  --  4.0 4.0  CL 100*  --  99* 101  --  100* 99*  CO2 24  --  22 26  --  25 25  GLUCOSE 90  --  132* 124*  --  98 62*  BUN 18  --  21* 16  --  21* 28*  CREATININE 2.49*  --  2.62* 2.32*  --  2.68* 2.77*  CALCIUM 8.0*  < > 8.3*  7.6* 8.1* 7.4* 8.3* 8.3*  PHOS  --   --  4.0  --   --  3.3 3.8  < > = values in this interval not displayed.  Liver Function Tests:  Recent Labs Lab 01/01/16 0644 01/02/16 0520 01/04/16 0630 01/07/16 0600  AST 45*  --   --   --   ALT 11*  --   --   --   ALKPHOS 87  --   --   --   BILITOT 1.7*  --   --   --   PROT 5.9*  --   --   --   ALBUMIN 2.5* 2.4* 2.4* 2.3*   No results for input(s): LIPASE, AMYLASE in the last 168 hours. No results for input(s): AMMONIA in the last 168 hours.  CBC:  Recent Labs Lab 01/01/16 0644 01/02/16 0520 01/03/16 0611 01/04/16 0625 01/07/16 0600   WBC 5.0 6.2 5.2 5.9 5.8  NEUTROABS 2.6  --   --   --   --   HGB 9.5* 9.5* 10.5* 10.2* 10.0*  HCT 29.4* 28.9* 32.7* 31.5* 31.1*  MCV 95.1 94.8 95.3 94.6 95.1  PLT 66* 68* 59* 72* 78*    Cardiac Enzymes: No results for input(s): CKTOTAL, CKMB, CKMBINDEX, TROPONINI in the last 168 hours.  BNP: Invalid input(s): POCBNP  CBG: No results for input(s): GLUCAP in the last 168 hours.  Microbiology: No results found for this or any previous visit.  Coagulation Studies: No results for input(s): LABPROT, INR in the last 72 hours.  Urinalysis: No results for input(s): COLORURINE, LABSPEC, PHURINE, GLUCOSEU, HGBUR, BILIRUBINUR, KETONESUR, PROTEINUR, UROBILINOGEN, NITRITE, LEUKOCYTESUR in the last 72 hours.  Invalid input(s): APPERANCEUR    Imaging: No results found.   Medications:       Assessment/ Plan:  75 y.o. female with medical problems of chronic kidney disease stage IV, diabetic nephropathy, plus history of  DVT, gout, chronic thrombocytopenia, liver cirrhosis, deconditioning, morbid obesity, who was admitted on 12/27/2015  Started HD about 12/19/2015   1.  Acute renal failure/CKD stage IV:  Patient seen and evaluated during hemodialysis. Tolerating well. Remains unclear as to whether she will be able to come off of dialysis.  Will continue HD for now Next treatment on Wednesday  Minimal UF due to low BP. Midodrine started D/c amlodipine nepro supplements  2.  Anemia of CKD:  Patient started on Aranesp 60 mcg subcutaneous weekly.  3.  Hyponatremia: serum sodium currently 132 and stable. Continue to monitor.   LOS:  Christina Duke 7/24/20172:31 PM

## 2016-01-09 LAB — RENAL FUNCTION PANEL
ANION GAP: 9 (ref 5–15)
Albumin: 2 g/dL — ABNORMAL LOW (ref 3.5–5.0)
BUN: 24 mg/dL — ABNORMAL HIGH (ref 6–20)
CHLORIDE: 100 mmol/L — AB (ref 101–111)
CO2: 24 mmol/L (ref 22–32)
CREATININE: 2.68 mg/dL — AB (ref 0.44–1.00)
Calcium: 8.2 mg/dL — ABNORMAL LOW (ref 8.9–10.3)
GFR, EST AFRICAN AMERICAN: 19 mL/min — AB (ref >60)
GFR, EST NON AFRICAN AMERICAN: 16 mL/min — AB (ref >60)
Glucose, Bld: 72 mg/dL (ref 65–99)
POTASSIUM: 4.2 mmol/L (ref 3.5–5.1)
Phosphorus: 3.4 mg/dL (ref 2.5–4.6)
Sodium: 133 mmol/L — ABNORMAL LOW (ref 135–145)

## 2016-01-09 LAB — CBC
HEMATOCRIT: 30.5 % — AB (ref 36.0–46.0)
HEMOGLOBIN: 9.6 g/dL — AB (ref 12.0–15.0)
MCH: 30.3 pg (ref 26.0–34.0)
MCHC: 31.5 g/dL (ref 30.0–36.0)
MCV: 96.2 fL (ref 78.0–100.0)
Platelets: 52 10*3/uL — ABNORMAL LOW (ref 150–400)
RBC: 3.17 MIL/uL — AB (ref 3.87–5.11)
RDW: 18.5 % — ABNORMAL HIGH (ref 11.5–15.5)
WBC: 5.5 10*3/uL (ref 4.0–10.5)

## 2016-01-09 NOTE — Progress Notes (Signed)
  [facility]  ROUNDING NOTE   Subjective:  Patient had HD earlier today UF 450 mL   At present, sitting up in recliner chair  appetite is so-sp   She had Paracentesis on 7/19. 2 L of ascitic fluid was removed    Objective:  Vital signs in last 24 hours:  Temperature 96.9 pulse 88 respirations 18 blood pressure 145/77  No intake/output data recorded.  Physical Exam: General: NAD, sitting up  Head: Normocephalic, atraumatic. Moist oral mucosal membranes  Eyes: Anicteric  Neck: Supple, trachea midline  Lungs:  Clear to auscultation normal effort  Heart: S1S2 no rubs  Abdomen:  Soft, nontender, distended, BS present   Extremities: trace peripheral edema b/l  Neurologic: Nonfocal, moving all four extremities  Skin: No lesions  Access: R IJ PC    Basic Metabolic Panel:  Recent Labs Lab 01/03/16 0611  01/04/16 0630 01/07/16 0600 01/09/16 0455  NA 132*  --  132* 133* 133*  K 3.7  --  4.0 4.0 4.2  CL 101  --  100* 99* 100*  CO2 26  --  25 25 24   GLUCOSE 124*  --  98 62* 72  BUN 16  --  21* 28* 24*  CREATININE 2.32*  --  2.68* 2.77* 2.68*  CALCIUM 8.1*  < > 8.3* 8.3* 8.2*  PHOS  --   --  3.3 3.8 3.4  < > = values in this interval not displayed.  Liver Function Tests:  Recent Labs Lab 01/04/16 0630 01/07/16 0600 01/09/16 0455  ALBUMIN 2.4* 2.3* 2.0*   No results for input(s): LIPASE, AMYLASE in the last 168 hours. No results for input(s): AMMONIA in the last 168 hours.  CBC:  Recent Labs Lab 01/03/16 0611 01/04/16 0625 01/07/16 0600 01/09/16 0455  WBC 5.2 5.9 5.8 5.5  HGB 10.5* 10.2* 10.0* 9.6*  HCT 32.7* 31.5* 31.1* 30.5*  MCV 95.3 94.6 95.1 96.2  PLT 59* 72* 78* 52*    Cardiac Enzymes: No results for input(s): CKTOTAL, CKMB, CKMBINDEX, TROPONINI in the last 168 hours.  BNP: Invalid input(s): POCBNP  CBG: No results for input(s): GLUCAP in the last 168 hours.  Microbiology: No results found for this or any previous  visit.  Coagulation Studies: No results for input(s): LABPROT, INR in the last 72 hours.  Urinalysis: No results for input(s): COLORURINE, LABSPEC, PHURINE, GLUCOSEU, HGBUR, BILIRUBINUR, KETONESUR, PROTEINUR, UROBILINOGEN, NITRITE, LEUKOCYTESUR in the last 72 hours.  Invalid input(s): APPERANCEUR    Imaging: No results found.   Medications:       Assessment/ Plan:  75 y.o. female with medical problems of chronic kidney disease stage IV, diabetic nephropathy, plus history of DVT, gout, chronic thrombocytopenia, liver cirrhosis, deconditioning, morbid obesity, who was admitted on 12/27/2015  Started HD about 12/19/2015   1.  Acute renal failure/CKD stage IV:  Remains unclear as to whether she will be able to come off of dialysis.  Will continue HD for now Next treatment on Friday Minimal UF due to low BP. Midodrine started D/c amlodipine nepro supplements  2.  Anemia of CKD:  Patient started on Aranesp 60 mcg subcutaneous weekly.  3.  Hyponatremia: secondary to cirrhosis - monitor   LOS:  Christina Duke 7/26/20173:39 PM

## 2016-01-11 LAB — RENAL FUNCTION PANEL
Albumin: 2.2 g/dL — ABNORMAL LOW (ref 3.5–5.0)
Anion gap: 8 (ref 5–15)
BUN: 22 mg/dL — AB (ref 6–20)
CHLORIDE: 101 mmol/L (ref 101–111)
CO2: 24 mmol/L (ref 22–32)
CREATININE: 2.96 mg/dL — AB (ref 0.44–1.00)
Calcium: 8.3 mg/dL — ABNORMAL LOW (ref 8.9–10.3)
GFR calc Af Amer: 17 mL/min — ABNORMAL LOW (ref >60)
GFR calc non Af Amer: 14 mL/min — ABNORMAL LOW (ref >60)
Glucose, Bld: 92 mg/dL (ref 65–99)
POTASSIUM: 4 mmol/L (ref 3.5–5.1)
Phosphorus: 3.2 mg/dL (ref 2.5–4.6)
Sodium: 133 mmol/L — ABNORMAL LOW (ref 135–145)

## 2016-01-11 LAB — CBC
HEMATOCRIT: 30 % — AB (ref 36.0–46.0)
Hemoglobin: 9.5 g/dL — ABNORMAL LOW (ref 12.0–15.0)
MCH: 30.4 pg (ref 26.0–34.0)
MCHC: 31.7 g/dL (ref 30.0–36.0)
MCV: 95.8 fL (ref 78.0–100.0)
PLATELETS: 49 10*3/uL — AB (ref 150–400)
RBC: 3.13 MIL/uL — ABNORMAL LOW (ref 3.87–5.11)
RDW: 18.7 % — AB (ref 11.5–15.5)
WBC: 5.8 10*3/uL (ref 4.0–10.5)

## 2016-01-11 NOTE — Progress Notes (Signed)
  [facility]  ROUNDING NOTE   Subjective:  Patient had HD earlier today Ended up net positive by 50 mL   At present, sitting up in recliner chair  appetite is so-so   She had Paracentesis on 7/19. 2 L of ascitic fluid was removed Tolerated dialysis well today No problems reported    Objective:  Vital signs in last 24 hours:  Temperature 98.2, respirations 21, pulse 82, blood pressure 134/77  No intake/output data recorded.  Physical Exam: General: NAD, sitting up  Head: Normocephalic, atraumatic. Moist oral mucosal membranes  Eyes: Anicteric  Neck: Supple, trachea midline  Lungs:  Clear to auscultation normal effort  Heart: S1S2 no rubs  Abdomen:  Soft, nontender, distended, BS present   Extremities: trace peripheral edema b/l  Neurologic: Nonfocal, moving all four extremities  Skin: No lesions  Access: R IJ PC    Basic Metabolic Panel:  Recent Labs Lab 01/07/16 0600 01/09/16 0455 01/11/16 0724  NA 133* 133* 133*  K 4.0 4.2 4.0  CL 99* 100* 101  CO2 25 24 24   GLUCOSE 62* 72 92  BUN 28* 24* 22*  CREATININE 2.77* 2.68* 2.96*  CALCIUM 8.3* 8.2* 8.3*  PHOS 3.8 3.4 3.2    Liver Function Tests:  Recent Labs Lab 01/07/16 0600 01/09/16 0455 01/11/16 0724  ALBUMIN 2.3* 2.0* 2.2*   No results for input(s): LIPASE, AMYLASE in the last 168 hours. No results for input(s): AMMONIA in the last 168 hours.  CBC:  Recent Labs Lab 01/07/16 0600 01/09/16 0455 01/11/16 0724  WBC 5.8 5.5 5.8  HGB 10.0* 9.6* 9.5*  HCT 31.1* 30.5* 30.0*  MCV 95.1 96.2 95.8  PLT 78* 52* 49*    Cardiac Enzymes: No results for input(s): CKTOTAL, CKMB, CKMBINDEX, TROPONINI in the last 168 hours.  BNP: Invalid input(s): POCBNP  CBG: No results for input(s): GLUCAP in the last 168 hours.  Microbiology: No results found for this or any previous visit.  Coagulation Studies: No results for input(s): LABPROT, INR in the last 72 hours.  Urinalysis: No results  for input(s): COLORURINE, LABSPEC, PHURINE, GLUCOSEU, HGBUR, BILIRUBINUR, KETONESUR, PROTEINUR, UROBILINOGEN, NITRITE, LEUKOCYTESUR in the last 72 hours.  Invalid input(s): APPERANCEUR    Imaging: No results found.   Medications:       Assessment/ Plan:  75 y.o. female with medical problems of chronic kidney disease stage IV, diabetic nephropathy, plus history of DVT, gout, chronic thrombocytopenia, liver cirrhosis, deconditioning, morbid obesity, who was admitted on 12/27/2015  Started HD about 12/19/2015   1.  Acute renal failure/CKD stage IV:  Remains unclear as to whether she will be able to come off of dialysis.  Will continue HD for now Next treatment on Monday Minimal UF due to low BP. Midodrine started D/c amlodipine nepro supplements  2.  Anemia of CKD:  Patient started on Aranesp 60 mcg subcutaneous weekly.  3.  Hyponatremia: secondary to cirrhosis - monitor  4. Ascites Scheduled for Paracentesis Started on torsemide 20 twice a day and spironolactone 12.5 mg daily by primary team   LOS:  Christina Duke 7/28/20173:43 PM

## 2016-01-12 ENCOUNTER — Other Ambulatory Visit (HOSPITAL_COMMUNITY): Payer: Self-pay

## 2016-01-12 LAB — BODY FLUID CELL COUNT WITH DIFFERENTIAL
LYMPHS FL: 44 %
Monocyte-Macrophage-Serous Fluid: 54 % (ref 50–90)
NEUTROPHIL FLUID: 2 % (ref 0–25)
WBC FLUID: 63 uL (ref 0–1000)

## 2016-01-12 LAB — GRAM STAIN

## 2016-01-12 MED ORDER — LIDOCAINE HCL (PF) 1 % IJ SOLN
INTRAMUSCULAR | Status: AC
  Filled 2016-01-12: qty 10

## 2016-01-12 NOTE — Procedures (Signed)
Ultrasound-guided diagnostic and therapeutic paracentesis performed yielding 9.2 liters of yellow fluid. No immediate complications. A portion of the fluid was sent to the lab for preordered studies.

## 2016-01-14 LAB — CBC
HCT: 32 % — ABNORMAL LOW (ref 36.0–46.0)
Hemoglobin: 10.7 g/dL — ABNORMAL LOW (ref 12.0–15.0)
MCH: 31.5 pg (ref 26.0–34.0)
MCHC: 33.4 g/dL (ref 30.0–36.0)
MCV: 94.1 fL (ref 78.0–100.0)
PLATELETS: 43 10*3/uL — AB (ref 150–400)
RBC: 3.4 MIL/uL — AB (ref 3.87–5.11)
RDW: 18.2 % — ABNORMAL HIGH (ref 11.5–15.5)
WBC: 7.4 10*3/uL (ref 4.0–10.5)

## 2016-01-14 LAB — RENAL FUNCTION PANEL
Albumin: 1.8 g/dL — ABNORMAL LOW (ref 3.5–5.0)
Anion gap: 7 (ref 5–15)
BUN: 28 mg/dL — ABNORMAL HIGH (ref 6–20)
CALCIUM: 7.9 mg/dL — AB (ref 8.9–10.3)
CO2: 23 mmol/L (ref 22–32)
CREATININE: 2.66 mg/dL — AB (ref 0.44–1.00)
Chloride: 102 mmol/L (ref 101–111)
GFR calc non Af Amer: 16 mL/min — ABNORMAL LOW (ref >60)
GFR, EST AFRICAN AMERICAN: 19 mL/min — AB (ref >60)
Glucose, Bld: 120 mg/dL — ABNORMAL HIGH (ref 65–99)
Phosphorus: 3.5 mg/dL (ref 2.5–4.6)
Potassium: 4.1 mmol/L (ref 3.5–5.1)
SODIUM: 132 mmol/L — AB (ref 135–145)

## 2016-01-14 LAB — PATHOLOGIST SMEAR REVIEW

## 2016-01-14 NOTE — Progress Notes (Signed)
[facility]  ROUNDING NOTE   Subjective:  Patient scheduled for HD later today Doing well No acute c/o Eating breakfast sitting up in bed  Paracentesis done on 7/29   9.2 L of ascitic fluid was removed by VIR   Objective:  Vital signs in last 24 hours:  Temperature 7, pulse 85, respirations 18, blood pressure 101/61  No intake/output data recorded.  Physical Exam: General: NAD, sitting up  Head: Normocephalic, atraumatic. Moist oral mucosal membranes  Eyes: Anicteric  Neck: Supple, trachea midline  Lungs:  Clear to auscultation normal effort  Heart: S1S2 no rubs  Abdomen:  Soft, nontender, distended, BS present   Extremities: trace peripheral edema b/l  Neurologic: Nonfocal, moving all four extremities  Skin: No lesions  Access: R IJ PC    Basic Metabolic Panel:  Recent Labs Lab 01/09/16 0455 01/11/16 0724 01/14/16 0456  NA 133* 133* 132*  K 4.2 4.0 4.1  CL 100* 101 102  CO2 GLUCOSE 72 92 120*  BUN 24* 22* 28*  CREATININE 2.68* 2.96* 2.66*  CALCIUM 8.2* 8.3* 7.9*  PHOS 3.4 3.2 3.5    Liver Function Tests:  Recent Labs Lab 01/09/16 0455 01/11/16 0724 01/14/16 0456  ALBUMIN 2.0* 2.2* 1.8*   No results for input(s): LIPASE, AMYLASE in the last 168 hours. No results for input(s): AMMONIA in the last 168 hours.  CBC:  Recent Labs Lab 01/09/16 0455 01/11/16 0724 01/14/16 0456  WBC 5.5 5.8 7.4  HGB 9.6* 9.5* 10.7*  HCT 30.5* 30.0* 32.0*  MCV 96.2 95.8 94.1  PLT 52* 49* PENDING    Cardiac Enzymes: No results for input(s): CKTOTAL, CKMB, CKMBINDEX, TROPONINI in the last 168 hours.  BNP: Invalid input(s): POCBNP  CBG: No results for input(s): GLUCAP in the last 168 hours.  Microbiology: Results for orders placed or performed during the hospital encounter of 12/27/15  Culture, body fluid-bottle     Status: None (Preliminary result)   Collection Time: 01/12/16 11:30 AM  Result Value Ref Range Status   Specimen  Description FLUID ASCITIC  Final   Special Requests NONE  Final   Culture NO GROWTH < 24 HOURS  Final   Report Status PENDING  Incomplete  Gram stain     Status: None   Collection Time: 01/12/16 11:30 AM  Result Value Ref Range Status   Specimen Description FLUID ASCITIC  Final   Special Requests NONE  Final   Gram Stain   Final    FEW WBC PRESENT, PREDOMINANTLY MONONUCLEAR NO ORGANISMS SEEN    Report Status 01/12/2016 FINAL  Final    Coagulation Studies: No results for input(s): LABPROT, INR in the last 72 hours.  Urinalysis: No results for input(s): COLORURINE, LABSPEC, PHURINE, GLUCOSEU, HGBUR, BILIRUBINUR, KETONESUR, PROTEINUR, UROBILINOGEN, NITRITE, LEUKOCYTESUR in the last 72 hours.  Invalid input(s): APPERANCEUR    Imaging: US Paracentesis  Result Date: 01/12/2016 INDICATION: Cirrhosis, chronic kidney disease, recurrent ascites. Request made for diagnostic and therapeutic paracentesis. EXAM: ULTRASOUND GUIDED DIAGNOSTIC AND THERAPEUTIC PARACENTESIS MEDICATIONS: None. COMPLICATIONS: None immediate. PROCEDURE: Informed written consent was obtained from the patient after a discussion of the risks, benefits and alternatives to treatment. A timeout was performed prior to the initiation of the procedure. Initial ultrasound scanning demonstrates a large amount of ascites within the right lower abdominal quadrant. The right lower abdomen was prepped and draped in the usual sterile fashion. 1% lidocaine was used for local anesthesia. Following this, a Yueh catheter was introduced. An  ultrasound image was saved for documentation purposes. The paracentesis was performed. The catheter was removed and a dressing was applied. The patient tolerated the procedure well without immediate post procedural complication. FINDINGS: A total of approximately 9.2 liters of yellow fluid was removed. Samples were sent to the laboratory as requested by the clinical team. IMPRESSION: Successful  ultrasound-guided diagnostic and therapeutic paracentesis yielding 9.2 liters of peritoneal fluid. Read by: Jeananne Rama, PA-C Electronically Signed   By: Richarda Overlie M.D.   On: 01/12/2016 13:46    Medications:       Assessment/ Plan:  75 y.o. female with medical problems of chronic kidney disease stage IV, diabetic nephropathy, plus history of DVT, gout, chronic thrombocytopenia, liver cirrhosis, deconditioning, morbid obesity, who was admitted on 12/27/2015  Started HD about 12/19/2015   1.  Acute renal failure/CKD stage IV:  Remains unclear as to whether she will be able to come off of dialysis.  Will continue HD for now Next treatment on Wednesday Minimal UF due to low BP. Midodrine started D/c amlodipine nepro supplements  2.  Anemia of CKD:  Patient continues on Aranesp 60 mcg subcutaneous weekly.  3.  Hyponatremia: secondary to cirrhosis - monitor  4. Ascites recentr Paracentesis on 7/29. 9.2 L removed. Started on torsemide 20 twice a day and spironolactone 12.5 mg daily by primary team   LOS:  Trayquan Kolakowski 7/31/20178:05 AM

## 2016-01-16 LAB — CBC
HCT: 29.9 % — ABNORMAL LOW (ref 36.0–46.0)
HEMOGLOBIN: 9.7 g/dL — AB (ref 12.0–15.0)
MCH: 30.8 pg (ref 26.0–34.0)
MCHC: 32.4 g/dL (ref 30.0–36.0)
MCV: 94.9 fL (ref 78.0–100.0)
Platelets: 51 10*3/uL — ABNORMAL LOW (ref 150–400)
RBC: 3.15 MIL/uL — ABNORMAL LOW (ref 3.87–5.11)
RDW: 18.7 % — ABNORMAL HIGH (ref 11.5–15.5)
WBC: 5.3 10*3/uL (ref 4.0–10.5)

## 2016-01-16 LAB — RENAL FUNCTION PANEL
ANION GAP: 8 (ref 5–15)
Albumin: 1.9 g/dL — ABNORMAL LOW (ref 3.5–5.0)
BUN: 22 mg/dL — ABNORMAL HIGH (ref 6–20)
CALCIUM: 8.1 mg/dL — AB (ref 8.9–10.3)
CHLORIDE: 100 mmol/L — AB (ref 101–111)
CO2: 25 mmol/L (ref 22–32)
Creatinine, Ser: 2.44 mg/dL — ABNORMAL HIGH (ref 0.44–1.00)
GFR calc Af Amer: 21 mL/min — ABNORMAL LOW (ref >60)
GFR calc non Af Amer: 18 mL/min — ABNORMAL LOW (ref >60)
GLUCOSE: 94 mg/dL (ref 65–99)
Phosphorus: 2.9 mg/dL (ref 2.5–4.6)
Potassium: 3.8 mmol/L (ref 3.5–5.1)
SODIUM: 133 mmol/L — AB (ref 135–145)

## 2016-01-16 NOTE — Progress Notes (Signed)
[facility]  ROUNDING NOTE   Subjective:  Patient completed hemodialysis today. She will be discharging to a nursing home. Overall in good spirits and currently sitting up in a chair.   Objective:  Vital signs in last 24 hours:  Temperature 98.8 pulse 97 respirations 19 blood pressure 113/60  No intake/output data recorded.  Physical Exam: General: NAD, sitting up  Head: Normocephalic, atraumatic. Moist oral mucosal membranes  Eyes: Anicteric  Neck: Supple, trachea midline  Lungs:  Clear to auscultation normal effort  Heart: S1S2 no rubs  Abdomen:  Soft, nontender, distended, BS present   Extremities: trace peripheral edema b/l  Neurologic: Nonfocal, moving all four extremities  Skin: No lesions  Access: R IJ PC    Basic Metabolic Panel:  Recent Labs Lab 01/11/16 0724 01/14/16 0456 01/16/16 0525  NA 133* 132* 133*  K 4.0 4.1 3.8  CL 101 102 100*  CO2 GLUCOSE 92 120* 94  BUN 22* 28* 22*  CREATININE 2.96* 2.66* 2.44*  CALCIUM 8.3* 7.9* 8.1*  PHOS 3.2 3.5 2.9    Liver Function Tests:  Recent Labs Lab 01/11/16 0724 01/14/16 0456 01/16/16 0525  ALBUMIN 2.2* 1.8* 1.9*   No results for input(s): LIPASE, AMYLASE in the last 168 hours. No results for input(s): AMMONIA in the last 168 hours.  CBC:  Recent Labs Lab 01/11/16 0724 01/14/16 0456 01/16/16 0525  WBC 5.8 7.4 5.3  HGB 9.5* 10.7* 9.7*  HCT 30.0* 32.0* 29.9*  MCV 95.8 94.1 94.9  PLT 49* 43* 51*    Cardiac Enzymes: No results for input(s): CKTOTAL, CKMB, CKMBINDEX, TROPONINI in the last 168 hours.  BNP: Invalid input(s): POCBNP  CBG: No results for input(s): GLUCAP in the last 168 hours.  Microbiology: Results for orders placed or performed during the hospital encounter of 12/27/15  Culture, body fluid-bottle     Status: None (Preliminary result)   Collection Time: 01/12/16 11:30 AM  Result Value Ref Range Status   Specimen Description FLUID ASCITIC  Final   Special Requests NONE  Final   Culture NO GROWTH 3 DAYS  Final   Report Status PENDING  Incomplete  Gram stain     Status: None   Collection Time: 01/12/16 11:30 AM  Result Value Ref Range Status   Specimen Description FLUID ASCITIC  Final   Special Requests NONE  Final   Gram Stain   Final    FEW WBC PRESENT, PREDOMINANTLY MONONUCLEAR NO ORGANISMS SEEN    Report Status 01/12/2016 FINAL  Final    Coagulation Studies: No results for input(s): LABPROT, INR in the last 72 hours.  Urinalysis: No results for input(s): COLORURINE, LABSPEC, PHURINE, GLUCOSEU, HGBUR, BILIRUBINUR, KETONESUR, PROTEINUR, UROBILINOGEN, NITRITE, LEUKOCYTESUR in the last 72 hours.  Invalid input(s): APPERANCEUR    Imaging: No results found.   Medications:       Assessment/ Plan:  75 y.o. female with medical problems of chronic kidney disease stage IV, diabetic nephropathy, plus history of DVT, gout, chronic thrombocytopenia, liver cirrhosis, deconditioning, morbid obesity, who was admitted on 12/27/2015  Started HD about 12/19/2015   1.  Acute renal failure/CKD stage IV:  The patient may have progressed to end-stage renal disease however does determination can be made as an outpatient. It appears that she is being discharged to a nursing home. She will need dialysis arrangements to be made as an outpatient.  2.  Anemia of CKD:  Hemoglobin currently 9.7 and close to target. Continue erythropoietin stability  agents as an outpatient.  3.  Hyponatremia: secondary to cirrhosis - sodium 133 earlier this a.m. And likely chronically low due to renal failure and liver failure.  4. Ascites recent Paracentesis on 7/29. 9.2 L removed. Started on torsemide 20 twice a day and spironolactone 12.5 mg daily by primary team   LOS:  Christina Duke 8/2/20179:46 AM

## 2016-01-17 LAB — CULTURE, BODY FLUID-BOTTLE: CULTURE: NO GROWTH

## 2016-01-17 LAB — CULTURE, BODY FLUID W GRAM STAIN -BOTTLE

## 2016-03-16 DEATH — deceased

## 2016-12-29 IMAGING — DX DG CHEST 1V PORT
1 series · 1 of 1 positions shown · non-contrast
Comparison: 12/28/2015.

CLINICAL DATA: Shortness of breath.

EXAM:
PORTABLE CHEST 1 VIEW

[chest ap]
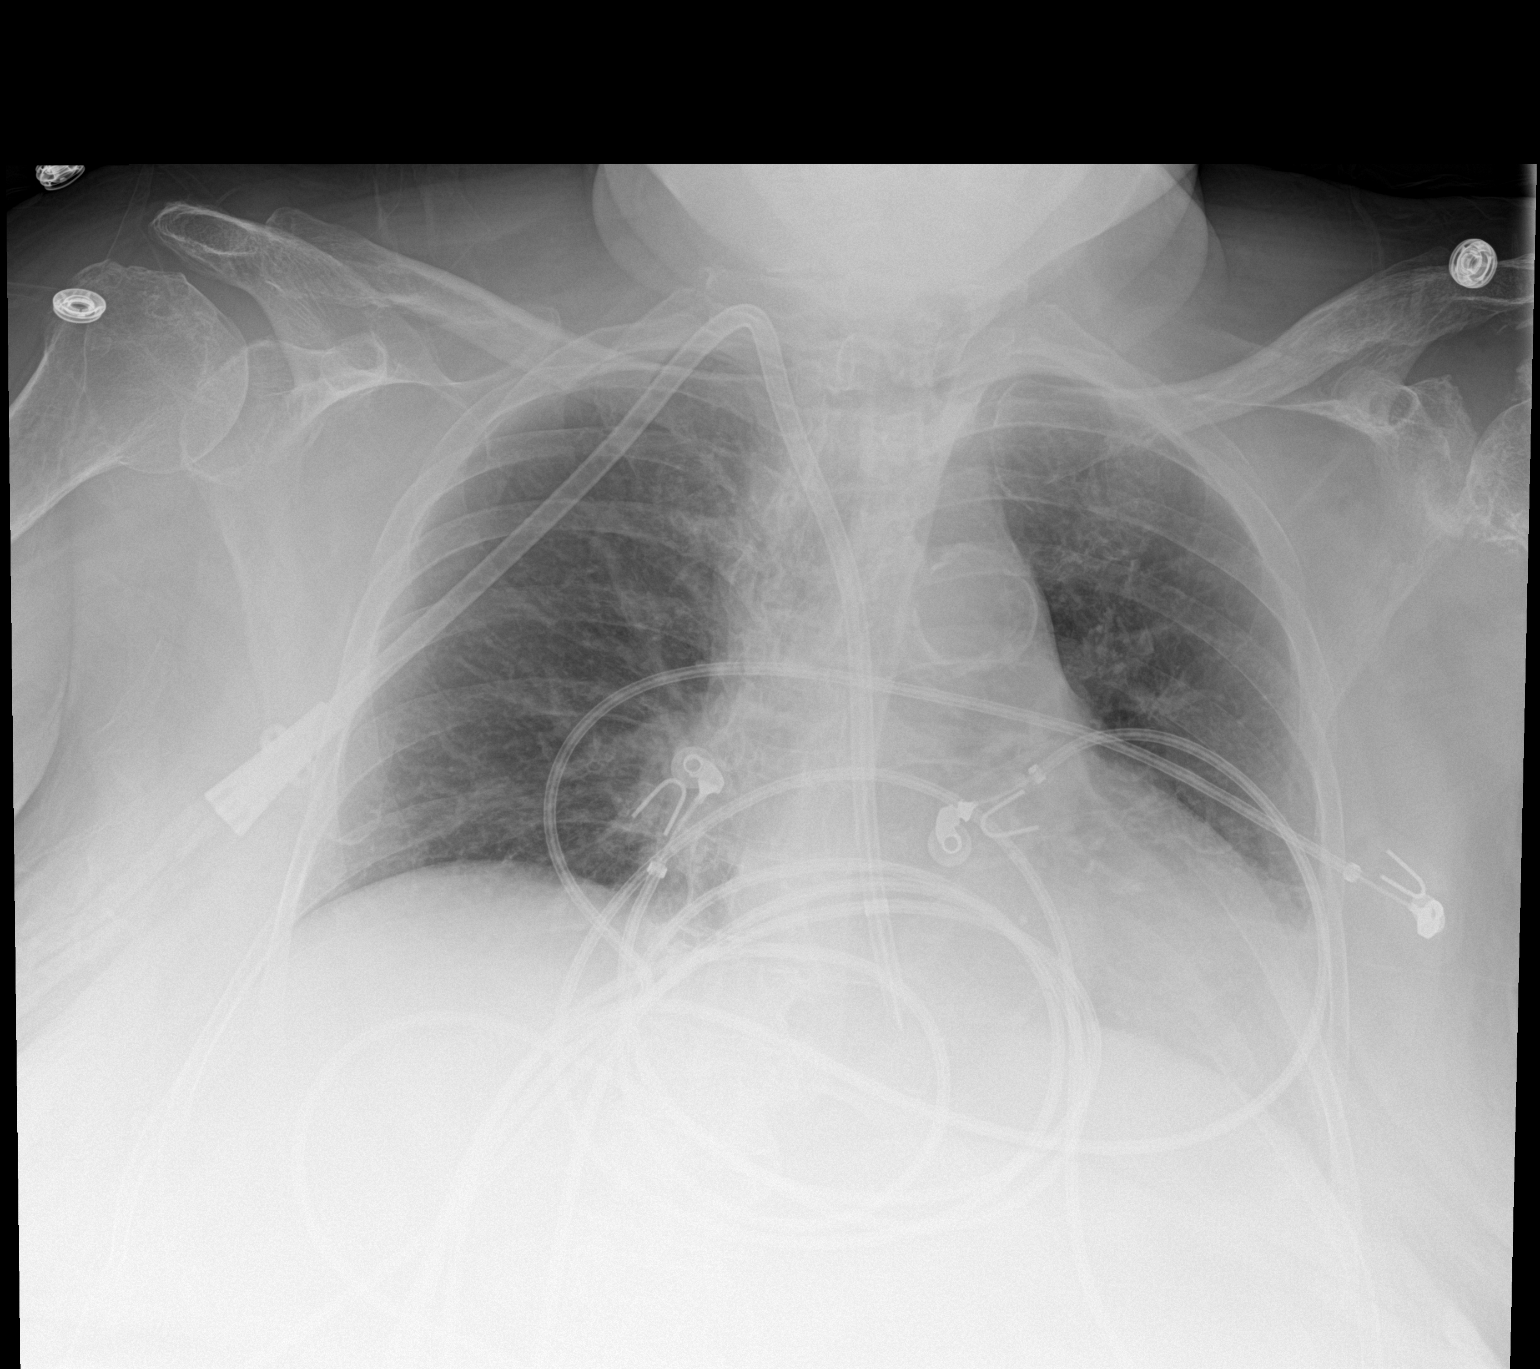

[1 of 1 positions shown; findings below may reference images not displayed]

FINDINGS: Right IJ dual-lumen catheter noted in stable position with tip in
the right atrium. Cardiomegaly with normal pulmonary vascularity.
Low lung volumes with mild bibasilar atelectasis. No pleural
effusion or pneumothorax. Severe degenerative changes left shoulder.
IMPRESSION: 1. Right IJ dual-lumen catheter noted with tip in the right atrium
in unchanged position.

2. Cardiomegaly. No evidence of overt congestive heart failure. Low
lung volumes with bibasilar atelectasis.

## 2018-07-13 IMAGING — US US PARACENTESIS
1 series · 2 of 2 positions shown · non-contrast
Comparison: None.

MEDICATIONS:
10 cc 1% lidocaine

COMPLICATIONS:
None immediate.

INDICATION: ascites

EXAM:
ULTRASOUND-GUIDED PARACENTESIS
TECHNIQUE: Informed written consent was obtained from the patient after a
discussion of the risks, benefits and alternatives to treatment. A
timeout was performed prior to the initiation of the procedure.

[Series 1: us paracentesis · 0.26mm/px · 2 of 2 slices shown]
[im 1/2]
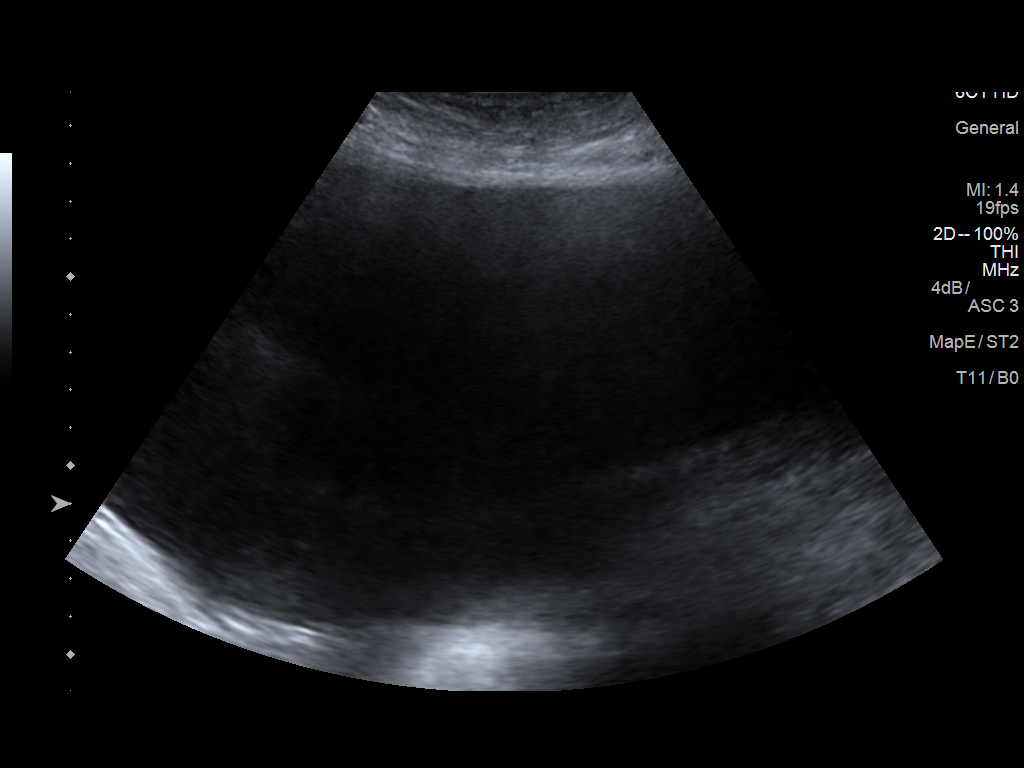
[im 2/2]
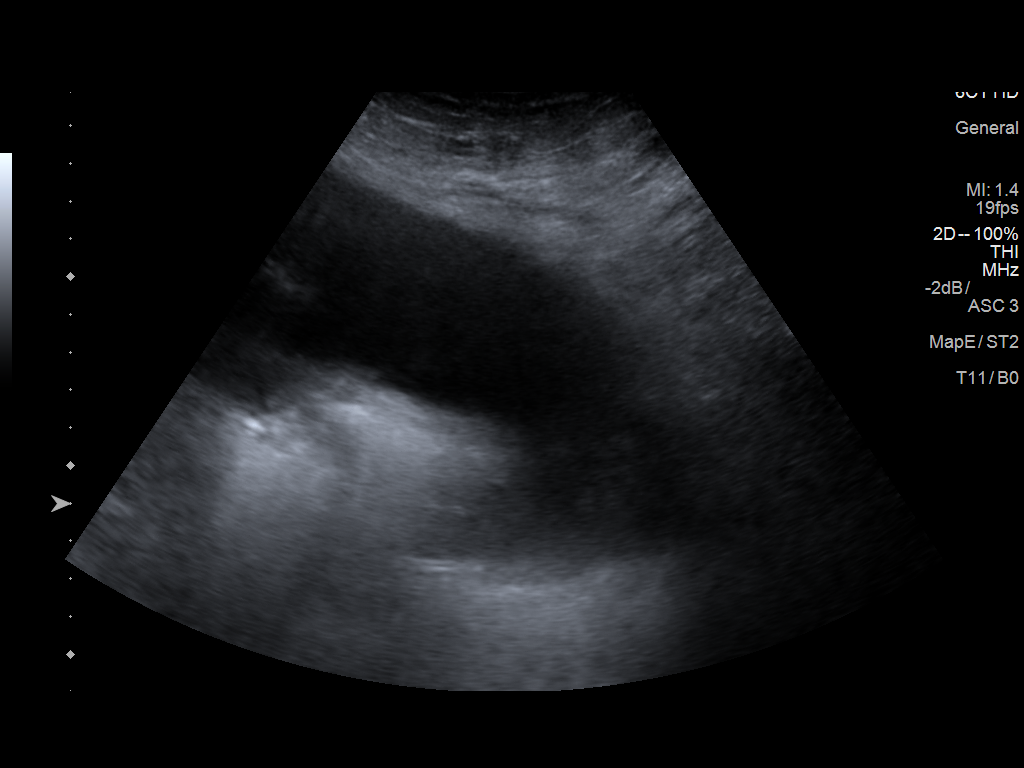

[2 of 2 positions shown; findings below may reference images not displayed]

Initial ultrasound scanning demonstrates a large amount of ascites
within the right lower abdominal quadrant. The right lower abdomen
was prepped and draped in the usual sterile fashion. 1% lidocaine
with epinephrine was used for local anesthesia. Under direct
ultrasound guidance, a 19 gauge, 7-cm, Yueh catheter was introduced.
An ultrasound image was saved for documentation purposed. The
paracentesis was performed. The catheter was removed and a dressing
was applied. The patient tolerated the procedure well without
immediate post procedural complication.
FINDINGS: A total of approximately 2 liters of yellow fluid was removed.

Maximum per [facility]
IMPRESSION: Successful ultrasound-guided paracentesis yielding 2 liters of
peritoneal fluid.

Read by

Don Lolito Onacram

## 2018-07-23 IMAGING — US US PARACENTESIS
1 series · 7 of 7 positions shown · non-contrast
Comparison: none

INDICATION: Cirrhosis, chronic kidney disease, recurrent ascites. Request made
for diagnostic and therapeutic paracentesis.

[Series 1: us paracentesis · 0.26mm/px · 7 of 7 slices shown]
[im 1/7]
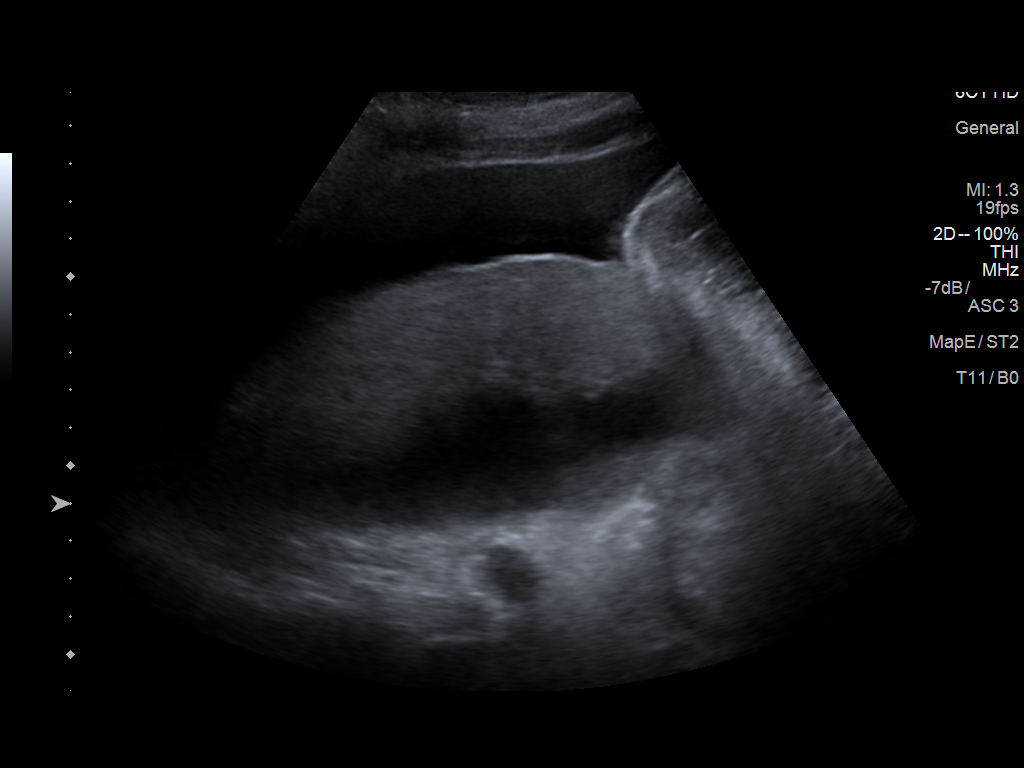
[im 2/7]
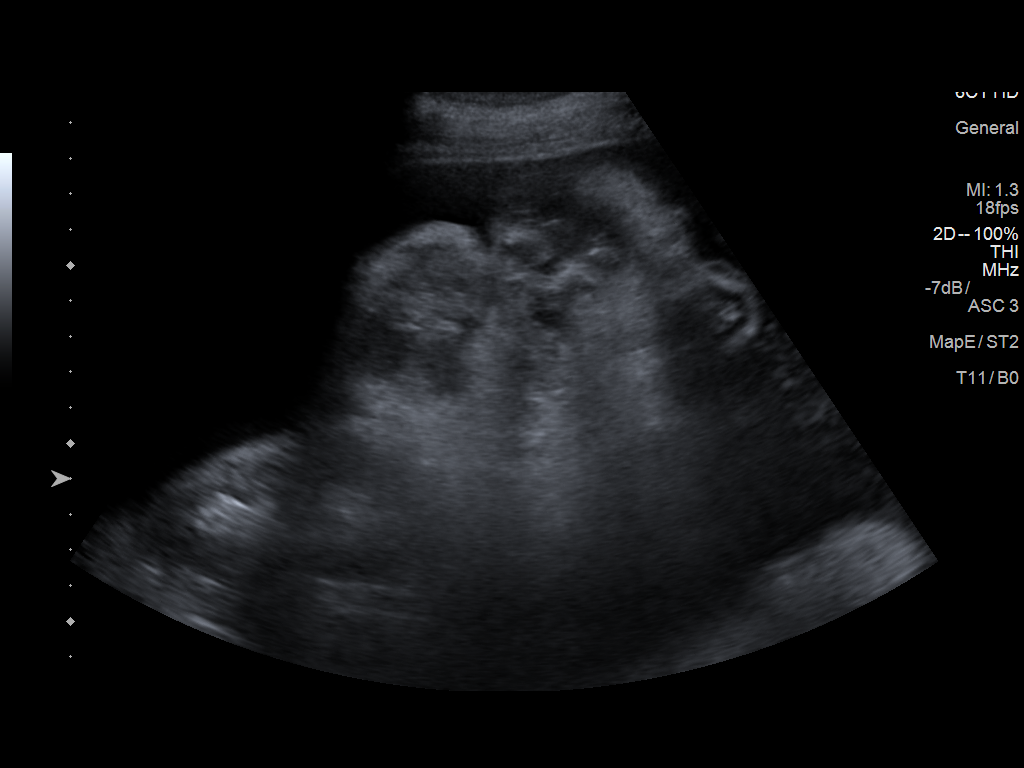
[im 3/7]
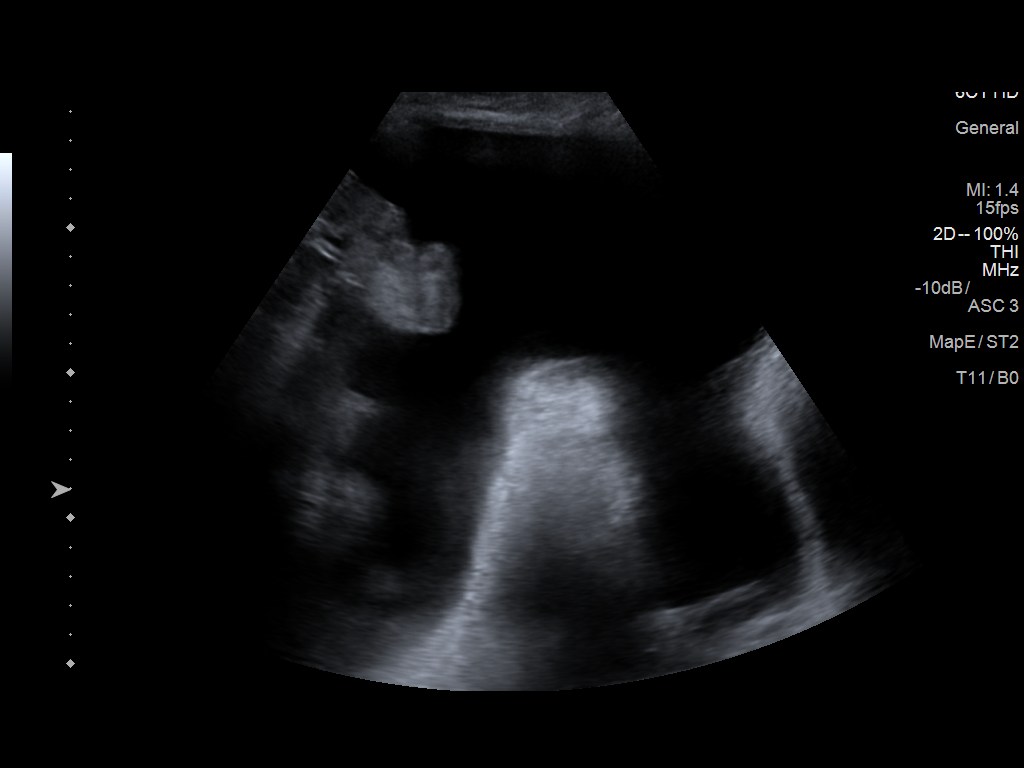
[im 4/7]
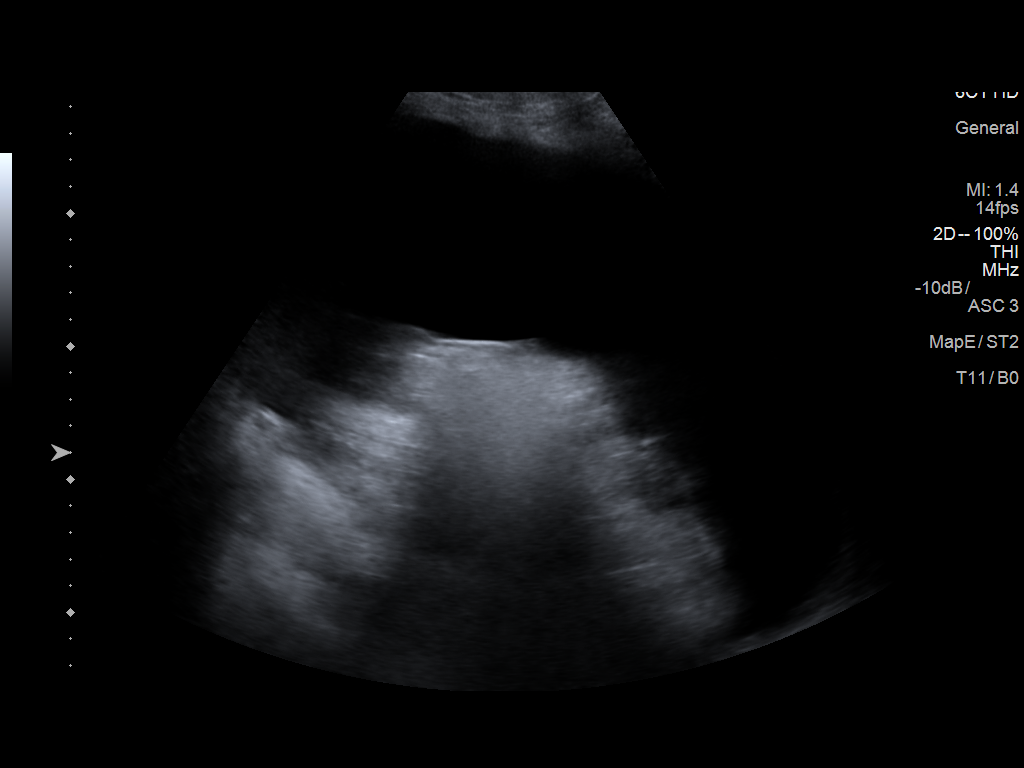
[im 5/7]
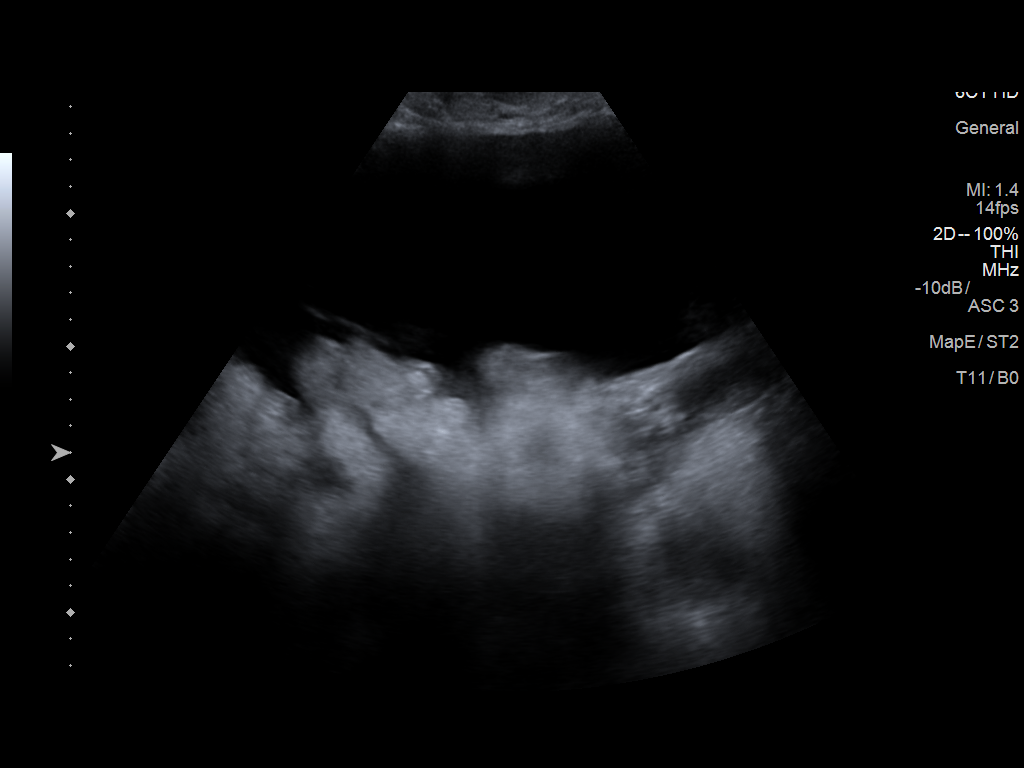
[im 6/7]
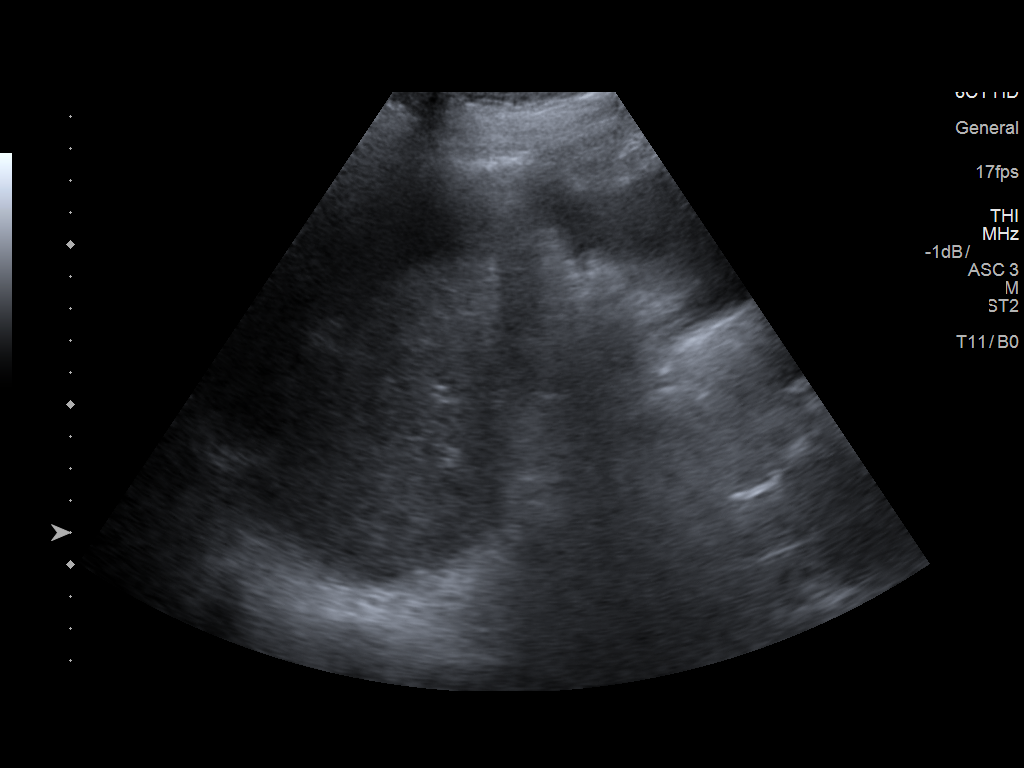
[im 7/7]
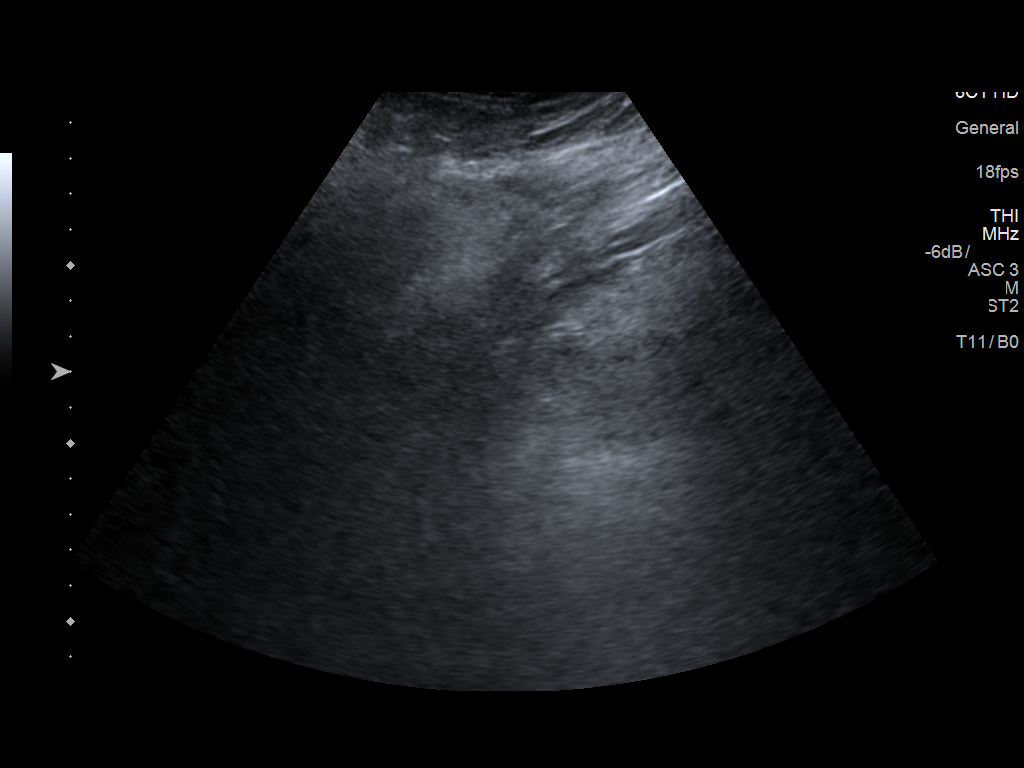

[7 of 7 positions shown; findings below may reference images not displayed]

EXAM:
ULTRASOUND GUIDED DIAGNOSTIC AND THERAPEUTIC PARACENTESIS

MEDICATIONS:
None.

COMPLICATIONS:
None immediate.

PROCEDURE:
Informed written consent was obtained from the patient after a
discussion of the risks, benefits and alternatives to treatment. A
timeout was performed prior to the initiation of the procedure.

Initial ultrasound scanning demonstrates a large amount of ascites
within the right lower abdominal quadrant. The right lower abdomen
was prepped and draped in the usual sterile fashion. 1% lidocaine
was used for local anesthesia.

Following this, a Yueh catheter was introduced. An ultrasound image
was saved for documentation purposes. The paracentesis was
performed. The catheter was removed and a dressing was applied. The
patient tolerated the procedure well without immediate post
procedural complication.
FINDINGS: A total of approximately 9.2 liters of yellow fluid was removed.
Samples were sent to the laboratory as requested by the clinical
team.
IMPRESSION: Successful ultrasound-guided diagnostic and therapeutic paracentesis
yielding 9.2 liters of peritoneal fluid.
